# Patient Record
Sex: Female | Born: 1975 | Hispanic: No | Marital: Married | Smoking: Former smoker
Health system: Southern US, Community
[De-identification: ages and names within clinical notes are randomized; demographics above are authoritative.]

## PROBLEM LIST (undated history)

## (undated) DIAGNOSIS — O24419 Gestational diabetes mellitus in pregnancy, unspecified control: Secondary | ICD-10-CM

## (undated) DIAGNOSIS — O1490 Unspecified pre-eclampsia, unspecified trimester: Secondary | ICD-10-CM

## (undated) DIAGNOSIS — R569 Unspecified convulsions: Secondary | ICD-10-CM

## (undated) HISTORY — DX: Unspecified convulsions: R56.9

## (undated) HISTORY — DX: Unspecified pre-eclampsia, unspecified trimester: O14.90

## (undated) HISTORY — DX: Gestational diabetes mellitus in pregnancy, unspecified control: O24.419

---
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
## 2001-01-20 ENCOUNTER — Other Ambulatory Visit: Admission: RE | Admit: 2001-01-20 | Discharge: 2001-01-20 | Payer: Self-pay | Admitting: Family Medicine

## 2001-02-28 ENCOUNTER — Other Ambulatory Visit: Admission: RE | Admit: 2001-02-28 | Discharge: 2001-02-28 | Payer: Self-pay | Admitting: Obstetrics and Gynecology

## 2002-05-22 ENCOUNTER — Other Ambulatory Visit: Admission: RE | Admit: 2002-05-22 | Discharge: 2002-05-22 | Payer: Self-pay | Admitting: Gynecology

## 2002-11-02 DIAGNOSIS — R569 Unspecified convulsions: Secondary | ICD-10-CM

## 2002-11-02 HISTORY — DX: Unspecified convulsions: R56.9

## 2003-09-19 ENCOUNTER — Other Ambulatory Visit: Admission: RE | Admit: 2003-09-19 | Discharge: 2003-09-19 | Payer: Self-pay | Admitting: Obstetrics and Gynecology

## 2004-05-05 ENCOUNTER — Encounter: Payer: Self-pay | Admitting: Family Medicine

## 2004-11-01 DIAGNOSIS — O1490 Unspecified pre-eclampsia, unspecified trimester: Secondary | ICD-10-CM

## 2004-11-01 HISTORY — DX: Unspecified pre-eclampsia, unspecified trimester: O14.90

## 2005-04-29 ENCOUNTER — Ambulatory Visit: Payer: Self-pay | Admitting: Family Medicine

## 2005-05-25 ENCOUNTER — Ambulatory Visit: Payer: Self-pay | Admitting: Family Medicine

## 2006-05-11 DIAGNOSIS — R569 Unspecified convulsions: Secondary | ICD-10-CM | POA: Insufficient documentation

## 2006-05-11 DIAGNOSIS — G43909 Migraine, unspecified, not intractable, without status migrainosus: Secondary | ICD-10-CM | POA: Insufficient documentation

## 2006-08-05 ENCOUNTER — Ambulatory Visit: Payer: Self-pay | Admitting: Family Medicine

## 2006-08-05 ENCOUNTER — Encounter: Payer: Self-pay | Admitting: Family Medicine

## 2006-10-22 ENCOUNTER — Ambulatory Visit: Payer: Self-pay | Admitting: Family Medicine

## 2006-10-22 DIAGNOSIS — F3289 Other specified depressive episodes: Secondary | ICD-10-CM | POA: Insufficient documentation

## 2006-10-22 DIAGNOSIS — F329 Major depressive disorder, single episode, unspecified: Secondary | ICD-10-CM

## 2006-11-29 ENCOUNTER — Ambulatory Visit: Payer: Self-pay | Admitting: Family Medicine

## 2006-11-29 DIAGNOSIS — I10 Essential (primary) hypertension: Secondary | ICD-10-CM | POA: Insufficient documentation

## 2007-01-04 ENCOUNTER — Ambulatory Visit: Payer: Self-pay | Admitting: Family Medicine

## 2007-02-15 ENCOUNTER — Ambulatory Visit: Payer: Self-pay | Admitting: Family Medicine

## 2007-03-24 ENCOUNTER — Ambulatory Visit: Payer: Self-pay | Admitting: Family Medicine

## 2007-08-08 ENCOUNTER — Ambulatory Visit: Payer: Self-pay | Admitting: Family Medicine

## 2007-08-08 DIAGNOSIS — F4322 Adjustment disorder with anxiety: Secondary | ICD-10-CM | POA: Insufficient documentation

## 2007-12-06 ENCOUNTER — Ambulatory Visit: Payer: Self-pay | Admitting: Family Medicine

## 2007-12-06 DIAGNOSIS — J309 Allergic rhinitis, unspecified: Secondary | ICD-10-CM | POA: Insufficient documentation

## 2008-05-23 ENCOUNTER — Ambulatory Visit: Payer: Self-pay | Admitting: Family Medicine

## 2008-06-20 ENCOUNTER — Ambulatory Visit: Payer: Self-pay | Admitting: Family Medicine

## 2008-07-30 ENCOUNTER — Ambulatory Visit: Payer: Self-pay | Admitting: Family Medicine

## 2009-06-03 ENCOUNTER — Ambulatory Visit: Payer: Self-pay | Admitting: Family Medicine

## 2009-06-24 ENCOUNTER — Ambulatory Visit: Payer: Self-pay | Admitting: Family Medicine

## 2009-08-23 ENCOUNTER — Ambulatory Visit: Payer: Self-pay | Admitting: Family Medicine

## 2010-09-02 NOTE — Assessment & Plan Note (Signed)
Summary: 2 MONTH FU Anxiety   Vital Signs:  Patient profile:   35 year old female Height:      65 inches Weight:      212 pounds Pulse rate:   61 / minute BP sitting:   115 / 59  (left arm) Cuff size:   large  Vitals Entered By: Kathlene November (August 23, 2009 3:11 PM) CC: check up anxiety- pt states feeling some better since increasing but still with anxiety   Primary Care Provider:  Nani Gasser MD  CC:  check up anxiety- pt states feeling some better since increasing but still with anxiety.  History of Present Illness: check up anxiety- pt states feeling some better since increasing but still with anxiety.  lets intense anxiety. Not as frequent.  Has had alot of family stressor lately.  No longer scared to go out of the house.  sleeping OK.  Not interested in counseling at this time.  Gets about 8 hours at night.   Current Medications (verified): 1)  Lamictal 100 Mg Tabs (Lamotrigine) .... Take Three Tabs By Mouth Two Times A Day 2)  Hydrochlorothiazide 25 Mg Tabs (Hydrochlorothiazide) .... Take 1 Tablet By Mouth Once A Day 3)  Atenolol 25 Mg Tabs (Atenolol) .... Take 1 1/2 Tablets By Mouth Once A Day For Migraines 4)  Citalopram Hydrobromide 40 Mg Tabs (Citalopram Hydrobromide) .... Take 1 Tablet By Mouth Once A Day 5)  Sumatriptan Succinate 100 Mg Tabs (Sumatriptan Succinate) .... Take 1 Tablet By Mouth Once A Day As Needed Migraine. Can Repeat Dose in 2 Hours As Needed .  Allergies (verified): No Known Drug Allergies  Comments:  Nurse/Medical Assistant: The patient's medications and allergies were reviewed with the patient and were updated in the Medication and Allergy Lists. Kathlene November (August 23, 2009 3:12 PM)  Physical Exam  General:  Well-developed,well-nourished,in no acute distress; alert,appropriate and cooperative throughout examination Psych:  Cognition and judgment appear intact. Alert and cooperative with normal attention span and concentration. No  apparent delusions, illusions, hallucinations   Impression & Recommendations:  Problem # 1:  ADJUSTMENT DISORDER WITH ANXIETY (ICD-309.24) Discussed optins. Can increase the citalopram or can augment the medication with a mood stabilizer or can send for counseling. She feel counseling is not really a time option reight now. After further discussion she woudl like to continue her current dose.  Then fu in 2-3 months. If feels her mood is worse then can increase med to 1.5 tabs and fu sooner.   Complete Medication List: 1)  Lamictal 100 Mg Tabs (Lamotrigine) .... Take three tabs by mouth two times a day 2)  Hydrochlorothiazide 25 Mg Tabs (Hydrochlorothiazide) .... Take 1 tablet by mouth once a day 3)  Atenolol 25 Mg Tabs (Atenolol) .... Take 1 1/2 tablets by mouth once a day for migraines 4)  Citalopram Hydrobromide 40 Mg Tabs (Citalopram hydrobromide) .... Take 1 tablet by mouth once a day 5)  Sumatriptan Succinate 100 Mg Tabs (Sumatriptan succinate) .... Take 1 tablet by mouth once a day as needed migraine. can repeat dose in 2 hours as needed . Prescriptions: CITALOPRAM HYDROBROMIDE 40 MG TABS (CITALOPRAM HYDROBROMIDE) Take 1 tablet by mouth once a day  #30 x 3   Entered and Authorized by:   Nani Gasser MD   Signed by:   Nani Gasser MD on 08/23/2009   Method used:   Electronically to        [facility] [phone]* (retail)  [address]       [place], [place]  [address]       Ph: [phone]       Fax: [phone]   RxID:   [id number]

## 2010-09-19 ENCOUNTER — Ambulatory Visit: Payer: Self-pay | Admitting: Family Medicine

## 2010-10-01 ENCOUNTER — Encounter: Payer: Self-pay | Admitting: Family Medicine

## 2010-10-01 ENCOUNTER — Ambulatory Visit (INDEPENDENT_AMBULATORY_CARE_PROVIDER_SITE_OTHER): Payer: 59 | Admitting: Family Medicine

## 2010-10-01 DIAGNOSIS — F4322 Adjustment disorder with anxiety: Secondary | ICD-10-CM

## 2010-10-02 ENCOUNTER — Encounter: Payer: Self-pay | Admitting: Family Medicine

## 2010-10-09 NOTE — Assessment & Plan Note (Signed)
Summary: F/U mood   Vital Signs:  Patient profile:   35 year old female Height:      65 inches Weight:      211 pounds Pulse rate:   66 / minute BP sitting:   115 / 75  (right arm) Cuff size:   large  Vitals Entered By: Avon Gully CMA, Duncan Dull) (October 01, 2010 8:41 AM) CC: f/u meds   Primary Care Provider:  Nani Gasser MD  CC:  f/u meds.  History of Present Illness: Most of the time taking a whole tab but occ increases to 1.5 tabs when having a bad day.  Hasfelt worse hte last 2 weeks.  Sleep is OK.  Has been tearful.  Feels like she wouldlike a rescue med for the days where she is intensely overwhelmed or fearful of leaving of the house.   Current Medications (verified): 1)  Lamictal 100 Mg Tabs (Lamotrigine) .... Take Three Tabs By Mouth Two Times A Day 2)  Hydrochlorothiazide 25 Mg Tabs (Hydrochlorothiazide) .... Take 1 Tablet By Mouth Once A Day 3)  Atenolol 25 Mg Tabs (Atenolol) .... Take 1 1/2 Tablets By Mouth Once A Day For Migraines 4)  Citalopram Hydrobromide 40 Mg Tabs (Citalopram Hydrobromide) .... Take 1 Tablet By Mouth Once A Day  Allergies (verified): No Known Drug Allergies  Comments:  Nurse/Medical Assistant: The patient's medications and allergies were reviewed with the patient and were updated in the Medication and Allergy Lists. Avon Gully CMA, Duncan Dull) (October 01, 2010 8:42 AM)  Physical Exam  General:  Well-developed,well-nourished,in no acute distress; alert,appropriate and cooperative throughout examination Psych:  Cognition and judgment appear intact. Alert and cooperative with normal attention span and concentration. 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  Increase the citalopram to 60mg  and f/u in one month Also discussed counseling. I convicned her theis may be really helpful. She has done it in the  past. She is OK with the referral.  Orders: Psychiatric Referral (Psych)  Problem # 2:  HYPERTENSION, BENIGN ESSENTIAL (ICD-401.1) Looks great today.  Her updated medication list for this problem includes:    Hydrochlorothiazide 25 Mg Tabs (Hydrochlorothiazide) .Marland Kitchen... Take 1 tablet by mouth once a day    Atenolol 25 Mg Tabs (Atenolol) .Marland Kitchen... Take 1 1/2 tablets by mouth once a day for migraines  Complete Medication List: 1)  Lamictal 100 Mg Tabs (Lamotrigine) .... Take three tabs by mouth two times a day 2)  Hydrochlorothiazide 25 Mg Tabs (Hydrochlorothiazide) .... Take 1 tablet by mouth once a day 3)  Atenolol 25 Mg Tabs (Atenolol) .... Take 1 1/2 tablets by mouth once a day for migraines 4)  Citalopram Hydrobromide 40 Mg Tabs (Citalopram hydrobromide) .... Take 1.5  tablet by mouth once a day 5)  Alprazolam 0.25 Mg Tabs (Alprazolam) .... Take 1 tablet by mouth once a day as needed  Patient Instructions: 1)  Please schedule a follow-up appointment in 1 month for mood.  Prescriptions: HYDROCHLOROTHIAZIDE 25 MG TABS (HYDROCHLOROTHIAZIDE) Take 1 tablet by mouth once a day  #90 x 1   Entered and Authorized by:   Nani Gasser MD   Signed by:   Nani Gasser MD on 10/01/2010   Method used:   Electronically to        [facility] [phone]* (retail)       [address]  [place], [place]  [address]       Ph: [phone]       Fax: [phone]   RxID:   [id number] ALPRAZOLAM 0.25 MG TABS (ALPRAZOLAM) Take 1 tablet by mouth once a day as needed  #15 x 0   Entered and Authorized by:   Nani Gasser MD   Signed by:   Nani Gasser MD on 10/01/2010   Method used:   Printed then faxed to ...       [facility] [phone]* (retail)       [address] [place], [place]  [address]       Ph: [phone]       Fax: [phone]   RxID:   [id number] CITALOPRAM HYDROBROMIDE 40 MG TABS (CITALOPRAM HYDROBROMIDE) Take 1.5  tablet by mouth once a day   #45 x 2   Entered and Authorized by:   Nani Gasser MD   Signed by:   Nani Gasser MD on 10/01/2010   Method used:   Electronically to        [facility] [phone]* (retail)       [address]       [place], [place]  [address]       Ph: [phone]       Fax: [phone]   RxID:   [id number]    Orders Added: 1)  Psychiatric Referral [Psych] 2)  Est. Patient Level III [09[address]]

## 2010-10-21 NOTE — Letter (Signed)
Summary: Generalized Anxiety Disorder Scale  Generalized Anxiety Disorder Scale   Imported By: Maryln Gottron 10/14/2010 12:47:15  _____________________________________________________________________  External Attachment:    Type:   Image     Comment:   External Document

## 2010-10-30 ENCOUNTER — Ambulatory Visit: Payer: 59 | Admitting: Family Medicine

## 2010-11-12 ENCOUNTER — Ambulatory Visit: Payer: 59 | Admitting: Family Medicine

## 2010-11-13 ENCOUNTER — Encounter: Payer: Self-pay | Admitting: Family Medicine

## 2010-11-23 ENCOUNTER — Encounter: Payer: Self-pay | Admitting: Family Medicine

## 2010-11-24 ENCOUNTER — Encounter: Payer: Self-pay | Admitting: Family Medicine

## 2010-11-24 ENCOUNTER — Ambulatory Visit (INDEPENDENT_AMBULATORY_CARE_PROVIDER_SITE_OTHER): Payer: 59 | Admitting: Family Medicine

## 2010-11-24 DIAGNOSIS — N920 Excessive and frequent menstruation with regular cycle: Secondary | ICD-10-CM | POA: Insufficient documentation

## 2010-11-24 DIAGNOSIS — F4322 Adjustment disorder with anxiety: Secondary | ICD-10-CM

## 2010-11-24 DIAGNOSIS — F329 Major depressive disorder, single episode, unspecified: Secondary | ICD-10-CM

## 2010-11-24 DIAGNOSIS — F3289 Other specified depressive episodes: Secondary | ICD-10-CM

## 2010-11-24 LAB — CBC
Hemoglobin: 12.3 g/dL (ref 12.0–15.0)
MCH: 28.1 pg (ref 26.0–34.0)
MCV: 87.7 fL (ref 78.0–100.0)
Platelets: 287 10*3/uL (ref 150–400)
RBC: 4.38 MIL/uL (ref 3.87–5.11)
WBC: 6.5 10*3/uL (ref 4.0–10.5)

## 2010-11-24 MED ORDER — FLUOXETINE HCL 20 MG PO TABS: ORAL_TABLET | ORAL | Status: DC

## 2010-11-24 NOTE — Assessment & Plan Note (Signed)
GAD - 7 score if 10 today,. She is still struggling with anxiety. Her depression is much better controlled. I'm also worried about QT prolongation with higher doses of citalopram. Based on this I offered her 3 options. One we could add a mood stabilizer to her her citalopram and try to drop back down to 40 mg. We could consider changing her to a different SSRI to see if we could get better control of her symptoms. We'll reconsider counseling. Patient opted to try maybe a different SSRI. We will change her to fluoxetine and see her back in 3 weeks. Reviewed once again the potential side effects. However I wrote down a taper for her.

## 2010-11-24 NOTE — Progress Notes (Signed)
  Subjective:    Patient ID: Julie Carroll, female    DOB: 10-16-1975, 35 y.o.   MRN: [id number]  Anxiety Presents for follow-up visit. Patient reports no chest pain or insomnia. Primary symptoms comment: moved part of citalopram to bedtime becasue too sedating.  Symptoms occur constantly. The severity of symptoms is moderate. The patient sleeps 8 hours per night. The quality of sleep is good.   Compliance with medications is 76-100%. Treatment side effects: sedation.  Using her xanax 3 times since was last here.  Usually uses half a tab. She is on 60mg  of citalopram.    She also has heavy periods and her GYN has asked that we check her Thryoid level.    Review of Systems  Cardiovascular: Negative for chest pain.  Psychiatric/Behavioral: The patient does not have insomnia.        Objective:   Physical Exam  Constitutional: She appears well-developed and well-nourished.  HENT:  Head: Normocephalic and atraumatic.  Neck: No thyromegaly present.  Cardiovascular: Normal rate, regular rhythm and normal heart sounds.   Pulmonary/Chest: Effort normal and breath sounds normal.  Lymphadenopathy:    She has no cervical adenopathy.          Assessment & Plan:

## 2010-11-24 NOTE — Assessment & Plan Note (Signed)
Will taper the citalopram and change to fluoxetine.  Then f/u first week of June.

## 2010-11-24 NOTE — Patient Instructions (Signed)
Cut the citalopram down to 40mg  daily ( 1 tab for 5 days), then 1/2 tab for 5 days, then 1/2 tab every other day for 10 days.  When get to the every other day then go ahead and start the fluoxetine. 10mg  daily (1/2 a tab) for one week, then increase to 20mg  daily.  We will call you with your lab results.

## 2010-11-25 NOTE — Progress Notes (Signed)
  Subjective:    Patient ID: Julie Carroll, female    DOB: May 11, 1976, 35 y.o.   MRN: [id number]  HPI    Review of Systems     Objective:   Physical Exam        Assessment & Plan:  Thyroid and blood count look great!

## 2010-11-26 ENCOUNTER — Ambulatory Visit (HOSPITAL_COMMUNITY): Payer: 59 | Admitting: Psychiatry

## 2010-11-28 ENCOUNTER — Telehealth: Payer: Self-pay | Admitting: Family Medicine

## 2010-11-28 NOTE — Telephone Encounter (Signed)
She can use the xanax through the weaning process, Does she need for citalopram for the wean?  If so can send over a refill.

## 2010-11-28 NOTE — Telephone Encounter (Signed)
Pt notified that she can continue using Xanex for weaning process, and pt stated she does have enough Citalopram for the weaning process. Jarvis Newcomer, LPN [facility]

## 2010-11-28 NOTE — Telephone Encounter (Signed)
Pt called in for triage this morning and said she was recently seen for weaning of Citalopram 60 mg.  She is down to her third day of 40mg  with two more days of this dose then further weaning as ordered.  She has used some of her Xanex  (1/2) 0.25 mg she had previously due to increase of anxeity, low level with chest hurting, an trouble concentrating.  Wants to know if Xanex through this process if okay until she can start her new med of Fluoxetine or would you advise of a different plan.  Please advise. PLAN:  Note sent to Dr. Linford Arnold and pending recommendations. Triage/SHT

## 2011-01-05 ENCOUNTER — Ambulatory Visit (HOSPITAL_COMMUNITY): Payer: 59 | Admitting: Psychiatry

## 2011-01-12 ENCOUNTER — Encounter: Payer: Self-pay | Admitting: Family Medicine

## 2011-01-12 ENCOUNTER — Ambulatory Visit (INDEPENDENT_AMBULATORY_CARE_PROVIDER_SITE_OTHER): Payer: PRIVATE HEALTH INSURANCE | Admitting: Family Medicine

## 2011-01-12 ENCOUNTER — Ambulatory Visit (INDEPENDENT_AMBULATORY_CARE_PROVIDER_SITE_OTHER): Payer: PRIVATE HEALTH INSURANCE | Admitting: Psychology

## 2011-01-12 DIAGNOSIS — F411 Generalized anxiety disorder: Secondary | ICD-10-CM

## 2011-01-12 DIAGNOSIS — F4322 Adjustment disorder with anxiety: Secondary | ICD-10-CM

## 2011-01-12 MED ORDER — ESCITALOPRAM OXALATE 20 MG PO TABS: 20.0000 mg | ORAL_TABLET | Freq: Every day | ORAL | Status: DC

## 2011-01-12 NOTE — Assessment & Plan Note (Addendum)
GAD-7 score of 5 today.  She still doesn't feel therapeutic.  Will change to lexapro 20mg  and see if gets a slightly better response. F/U in 3 weeks.

## 2011-01-12 NOTE — Progress Notes (Signed)
  Subjective:    Patient ID: Julie Carroll, female    DOB: 13-Mar-1976, 35 y.o.   MRN: [id number]  HPI Started the fluoxetine and felt nauseated and more irritable. Then stopped it.  Then restarted half the celexa.  Then increased to 40mg .  WAs having some breakthrough anxiety on the 60mg . She started her counseling today.  Now feeling better. Nausea has abated.    Review of Systems     Objective:   Physical Exam  Constitutional: She is oriented to person, place, and time.  Neurological: She is alert and oriented to person, place, and time.  Psychiatric: She has a normal mood and affect. Her behavior is normal.          Assessment & Plan:

## 2011-01-26 ENCOUNTER — Encounter (INDEPENDENT_AMBULATORY_CARE_PROVIDER_SITE_OTHER): Payer: PRIVATE HEALTH INSURANCE | Admitting: Psychology

## 2011-01-26 DIAGNOSIS — F411 Generalized anxiety disorder: Secondary | ICD-10-CM

## 2011-01-28 ENCOUNTER — Ambulatory Visit (HOSPITAL_COMMUNITY): Payer: 59 | Admitting: Psychiatry

## 2011-02-05 ENCOUNTER — Ambulatory Visit (INDEPENDENT_AMBULATORY_CARE_PROVIDER_SITE_OTHER): Payer: PRIVATE HEALTH INSURANCE | Admitting: Family Medicine

## 2011-02-05 ENCOUNTER — Encounter: Payer: Self-pay | Admitting: Family Medicine

## 2011-02-05 DIAGNOSIS — F4322 Adjustment disorder with anxiety: Secondary | ICD-10-CM

## 2011-02-05 NOTE — Progress Notes (Signed)
  Subjective:    Patient ID: Julie Carroll, female    DOB: 1976-05-16, 35 y.o.   MRN: [id number]  HPI  Doing well on the Lexapro. Feels more therapeutic.  Hasn't had to use her xanax. Sleeping well. No SE of the medications.  Moved ti to bedtime. This is workin better than the citalopram. She is on the generic.  Has had some very stressful things happen this week and says she really handled it well overall.   Review of Systems     Objective:   Physical Exam  Constitutional: She is oriented to person, place, and time. She appears well-developed and well-nourished.  Cardiovascular: Normal rate, regular rhythm and normal heart sounds.   Pulmonary/Chest: Effort normal and breath sounds normal.  Neurological: She is alert and oriented to person, place, and time.  Skin: Skin is warm and dry.  Psychiatric: She has a normal mood and affect. Her behavior is normal.          Assessment & Plan:

## 2011-02-05 NOTE — Assessment & Plan Note (Signed)
Improved and she feels more therapeutic on the lexapro. GAD-7 score of 7 today. F/U in 3 months.

## 2011-02-10 ENCOUNTER — Encounter (HOSPITAL_COMMUNITY): Payer: PRIVATE HEALTH INSURANCE | Admitting: Psychology

## 2011-02-18 ENCOUNTER — Telehealth: Payer: Self-pay | Admitting: Family Medicine

## 2011-02-18 NOTE — Telephone Encounter (Signed)
Pt called and having severe leg pain X 1 1/2 weeks. Plan:m LMOM for the pt told her to call office and sched an appt to be seen. Jarvis Newcomer, LPN [facility]

## 2011-02-19 ENCOUNTER — Encounter (HOSPITAL_COMMUNITY): Payer: PRIVATE HEALTH INSURANCE | Admitting: Psychology

## 2011-03-05 ENCOUNTER — Encounter (HOSPITAL_COMMUNITY): Payer: PRIVATE HEALTH INSURANCE | Admitting: Psychology

## 2011-03-11 ENCOUNTER — Encounter (INDEPENDENT_AMBULATORY_CARE_PROVIDER_SITE_OTHER): Payer: PRIVATE HEALTH INSURANCE | Admitting: Psychology

## 2011-03-11 DIAGNOSIS — F411 Generalized anxiety disorder: Secondary | ICD-10-CM

## 2011-03-12 ENCOUNTER — Other Ambulatory Visit: Payer: Self-pay | Admitting: *Deleted

## 2011-03-12 MED ORDER — ALPRAZOLAM 0.25 MG PO TABS: 0.2500 mg | ORAL_TABLET | Freq: Every day | ORAL | Status: DC | PRN

## 2011-03-13 ENCOUNTER — Other Ambulatory Visit: Payer: Self-pay | Admitting: Family Medicine

## 2011-03-24 ENCOUNTER — Telehealth: Payer: Self-pay | Admitting: Family Medicine

## 2011-03-24 NOTE — Telephone Encounter (Signed)
Patient called advised that she received her prescription at pharmacy from [facility] but there was a note attached that states she needs to make an appointment and she has appointment scheduled for Oct. Pt left vm to see what the note attached to med was in reference too

## 2011-04-01 ENCOUNTER — Encounter (HOSPITAL_COMMUNITY): Payer: PRIVATE HEALTH INSURANCE | Admitting: Psychology

## 2011-04-09 ENCOUNTER — Encounter (INDEPENDENT_AMBULATORY_CARE_PROVIDER_SITE_OTHER): Payer: PRIVATE HEALTH INSURANCE | Admitting: Psychology

## 2011-04-09 DIAGNOSIS — F411 Generalized anxiety disorder: Secondary | ICD-10-CM

## 2011-04-21 ENCOUNTER — Telehealth: Payer: Self-pay | Admitting: Family Medicine

## 2011-04-21 MED ORDER — HYDROCHLOROTHIAZIDE 25 MG PO TABS: 25.0000 mg | ORAL_TABLET | Freq: Every day | ORAL | Status: DC

## 2011-04-22 NOTE — Telephone Encounter (Signed)
Closed

## 2011-04-30 ENCOUNTER — Encounter (INDEPENDENT_AMBULATORY_CARE_PROVIDER_SITE_OTHER): Payer: PRIVATE HEALTH INSURANCE | Admitting: Psychology

## 2011-04-30 DIAGNOSIS — F411 Generalized anxiety disorder: Secondary | ICD-10-CM

## 2011-05-08 ENCOUNTER — Telehealth: Payer: Self-pay | Admitting: *Deleted

## 2011-05-08 MED ORDER — CYCLOBENZAPRINE HCL 10 MG PO TABS: 10.0000 mg | ORAL_TABLET | Freq: Every evening | ORAL | Status: AC | PRN

## 2011-05-08 MED ORDER — TRAMADOL HCL 50 MG PO TABS: 50.0000 mg | ORAL_TABLET | Freq: Three times a day (TID) | ORAL | Status: DC | PRN

## 2011-05-08 NOTE — Telephone Encounter (Signed)
Pt notified of MD instructions. Meds entered and sent to [facility] in [place]. KJ LPN

## 2011-05-08 NOTE — Telephone Encounter (Signed)
OK for flexeril 10mg  at bedtime and tramadol 50mg  up to tid. Also ice packs to the area. Can schedule for Monday if not getting better over the weekend.

## 2011-05-08 NOTE — Telephone Encounter (Signed)
Pt calls and states was sitting in a chair yesterday and it broke and is now having pain in shoulder blades down to waist. No radicular pain. Afraid to bend over in fear won't get back up. Feels tight and sore to touch. No appts today. Is there anything you can give her

## 2011-05-12 ENCOUNTER — Ambulatory Visit (INDEPENDENT_AMBULATORY_CARE_PROVIDER_SITE_OTHER): Payer: PRIVATE HEALTH INSURANCE | Admitting: Family Medicine

## 2011-05-12 ENCOUNTER — Encounter: Payer: Self-pay | Admitting: Family Medicine

## 2011-05-12 VITALS — BP 112/62 | HR 76 | Wt 232.0 lb

## 2011-05-12 DIAGNOSIS — M79606 Pain in leg, unspecified: Secondary | ICD-10-CM

## 2011-05-12 DIAGNOSIS — M949 Disorder of cartilage, unspecified: Secondary | ICD-10-CM

## 2011-05-12 DIAGNOSIS — F419 Anxiety disorder, unspecified: Secondary | ICD-10-CM

## 2011-05-12 DIAGNOSIS — M898X9 Other specified disorders of bone, unspecified site: Secondary | ICD-10-CM

## 2011-05-12 DIAGNOSIS — M79609 Pain in unspecified limb: Secondary | ICD-10-CM

## 2011-05-12 DIAGNOSIS — F411 Generalized anxiety disorder: Secondary | ICD-10-CM

## 2011-05-12 DIAGNOSIS — Z23 Encounter for immunization: Secondary | ICD-10-CM

## 2011-05-12 DIAGNOSIS — M899 Disorder of bone, unspecified: Secondary | ICD-10-CM

## 2011-05-12 DIAGNOSIS — M549 Dorsalgia, unspecified: Secondary | ICD-10-CM

## 2011-05-12 MED ORDER — HYDROCODONE-ACETAMINOPHEN 5-325 MG PO TABS: 2.0000 | ORAL_TABLET | Freq: Four times a day (QID) | ORAL | Status: AC | PRN

## 2011-05-12 NOTE — Patient Instructions (Addendum)
Back Pain Back pain is common. It can be the result of a muscle pull or strain. It can also come from changes in the bones and nerves in the spine. These changes can come from an injury or from calcium loss in your bones (osteoporosis).   HOME CARE  Listen to your doctor about how active you should be. Back pain can get worse with activity that is not approved by your doctor.   Avoid sports with sudden movements.   Ask your doctor for a list of exercises to make your back muscles stronger.   Learn how to lift or move things safely to protect your back.   Sit up straight.   Sometimes, using ice for 10 minutes, 2 times per day can help the pain.   Put ice in a plastic bag.   Place a towel between the skin and the bag.   Sometimes heat or a hot shower will help the pain, especially if it is from a muscle cramp.  GET HELP RIGHT AWAY IF:  You have numbness, tingling, weakness, or problems with the use of your arms or legs.   You get very bad back pain that does not go away with medicine.   You cannot control when you poop (bowel movement) or pee (urinate).   You have increasing pain in any area of the body. This includes your belly (abdomen).   You are short of breath, dizzy, or feel faint.   You feel sick to your stomach (nauseous), throw up (vomit), or get sweaty.   You notice your toes, legs, or feet change color or get cold.   You have been injured and your back hurts.   Your back pain gets worse.   You have a temperature by mouth above 101, not controlled by medicine.  MAKE SURE YOU:    Understand these instructions.   Will watch your condition.   Will get help right away if you are not doing well or get worse.  Document Released: 01/06/2008 Document Re-Released: 10/14/2009 [facility] Patient Information 2011 [place], [place].

## 2011-05-12 NOTE — Progress Notes (Signed)
Subjective:    Patient ID: Julie Carroll, female    DOB: [birth year], 35 y.o.   MRN: [id number]  HPI Anxiety  -doing well.  Has been seeing Julie Carroll.  This has been really helpful. She feels she would like to continue the medication for now. She and We have decided to wean back her visits.  Hurt Back on Thursday (about 6 days) pain initlaly over the thoracid spine, now more on the left. Using flexeril at bedtime and this time.  Was also given tramadol but caused some HA.  Also using motrin 600mg  nad not really helping.  Using ice. No stretches. No radiation of pain tino her legs. No numbness or weakness. No really getting symptoms. She says it was worse last night she tried to pick up the ceramic bowl from her crockpot. She has sudden sharp pain at that point.  Chronci bilat leg pain since a child. In upper and lower legs .Describes as an ache. No cramping or charlie horses. Tried eating bananas but no relief. She will usually tries up a hot tub massage her muscles and this does seem to help some. She's not sure why she gets this. She said when she was a child she was told was going pains and that she would outgrow it but she never has. She feels is actually been getting worse lately. No worsening symptoms. She has tried some stretches but this does not seem to help.  Review of Systems BP 112/62  Pulse 76  Wt 232 lb (105.235 kg)    Allergies  Allergen Reactions  . Fluoxetine     Nausea and inc irritability    Past Medical History  Diagnosis Date  . Gestational diabetes   . Preeclampsia 4/06  . Seizures 4/04    Past Surgical History  Procedure Date  . Cesarean section 4/06  . Fatty tumor removed 1996    History   Social History  . Marital Status: Married    Spouse Name: N/A    Number of Children: N/A  . Years of Education: N/A   Occupational History  . Not on file.   Social History Main Topics  . Smoking status: Never Smoker   . Smokeless tobacco: Not on file  . Alcohol  Use: No  . Drug Use: No  . Sexually Active: Yes    Birth Control/ Protection: Condom   Other Topics Concern  . Not on file   Social History Narrative  . No narrative on file    Family History  Problem Relation Age of Onset  . Diabetes Father   . Cancer Maternal Grandmother     skin    Julie Carroll had no medications administered during this visit.     Objective:   Physical Exam  Constitutional: She is oriented to person, place, and time. She appears well-developed and well-nourished.  Cardiovascular: Normal rate, regular rhythm and normal heart sounds.   Pulmonary/Chest: Effort normal and breath sounds normal.  Musculoskeletal:       Normal flexion and extension Pain with extension. Normal rotation right and left and side bending. Neg straight leg raise. Hip, knee and ankle strength 5/5 bilat patellar and bicep reflexes 1+ bilat.   Neurological: She is alert and oriented to person, place, and time.  Skin: Skin is warm and dry.  Psychiatric: She has a normal mood and affect. Her behavior is normal.          Assessment & Plan:  Anxiety - doing well on  her Lexapro. GAD- 7 score of 5. F/U in 4 months. She is doing really well. She would like to continue her current regimen. When I see her back in 4 months we can discuss potentially weaning her off if she is doing really well.  Acute Left low back pain- Given stretches. Use hydrocodone at bedtime. She doesn't need refill on her muscle relaxer. If she is not improving in the next couple weeks and consider formal physical therapy. He also consider x-rays at that time. For her to continue with her anti-inflammatory of Motrin 600 mg 3 times a day with food and water.  Chroinc leg pain-unclear etiology at this point. I doubt this is significant musculoskeletal disorder. I will check a CK, and a sedimentation rate. Also consider that this could be more bone type pain. She says her mom has a history of low vitamin D. I did recommend  that we check her level as well. She says she does try to do stretches and exercises to help it but it doesn't really seem to make a difference. She says sometimes it's so painful she is almost in tears. This is definitely not medication related as it started in her childhood.

## 2011-05-28 ENCOUNTER — Encounter (HOSPITAL_COMMUNITY): Payer: PRIVATE HEALTH INSURANCE | Admitting: Psychology

## 2011-06-11 ENCOUNTER — Encounter (HOSPITAL_COMMUNITY): Payer: Self-pay | Admitting: Psychology

## 2011-06-11 ENCOUNTER — Ambulatory Visit (INDEPENDENT_AMBULATORY_CARE_PROVIDER_SITE_OTHER): Payer: PRIVATE HEALTH INSURANCE | Admitting: Psychology

## 2011-06-11 DIAGNOSIS — F411 Generalized anxiety disorder: Secondary | ICD-10-CM

## 2011-06-11 DIAGNOSIS — F419 Anxiety disorder, unspecified: Secondary | ICD-10-CM

## 2011-06-11 NOTE — Progress Notes (Signed)
   THERAPIST PROGRESS NOTE  Session Time: 805- 828 am  Participation Level: Active  Behavioral Response: Well GroomedAlertEuthymic  Type of Therapy: Individual Therapy  Treatment Goals addressed: Diagnosis: generalized anxiety  Interventions: CBT, Solution Focused and Strength-based  Summary: Julie Carroll is a 35 y.o. female who presents with anxiety issues.  She has been doing well for the past several months and has been managing her anxiety effectively.  She reports when she notices the anxiety she begins dealing with it and will not ignore.  She believes that she is ready to take a break from therapy with plans to come back after the holidays.  We talked about her plans for the holidays and how she manages.  She admits that it is stress-provoking and we discussed what was in her control and what she could do when feeling overwhelmed.  She talked about not being able to join her family on the holidays due to her father's refusal to permit her husband Jerod in the home.  Her mother talks about this and the client occasionally notices she allows this to influence her mood.   Suicidal/Homicidal: No  Plan: Return again in January 2012 for check-in visit.  Diagnosis: Axis I: Generalized Anxiety Disorder    Axis II: No diagnosis    Salley Scarlet, [facility] 06/11/2011

## 2011-07-15 ENCOUNTER — Other Ambulatory Visit: Payer: Self-pay | Admitting: *Deleted

## 2011-07-15 MED ORDER — ESCITALOPRAM OXALATE 20 MG PO TABS: 20.0000 mg | ORAL_TABLET | Freq: Every day | ORAL | Status: DC

## 2011-07-16 ENCOUNTER — Other Ambulatory Visit: Payer: Self-pay | Admitting: *Deleted

## 2011-07-16 MED ORDER — HYDROCHLOROTHIAZIDE 25 MG PO TABS: 25.0000 mg | ORAL_TABLET | Freq: Every day | ORAL | Status: DC

## 2011-10-19 ENCOUNTER — Encounter: Payer: Self-pay | Admitting: Family Medicine

## 2011-10-19 ENCOUNTER — Ambulatory Visit (INDEPENDENT_AMBULATORY_CARE_PROVIDER_SITE_OTHER): Payer: PRIVATE HEALTH INSURANCE | Admitting: Family Medicine

## 2011-10-19 VITALS — BP 119/64 | HR 70 | Wt 241.0 lb

## 2011-10-19 DIAGNOSIS — E669 Obesity, unspecified: Secondary | ICD-10-CM

## 2011-10-19 DIAGNOSIS — J309 Allergic rhinitis, unspecified: Secondary | ICD-10-CM

## 2011-10-19 DIAGNOSIS — G43909 Migraine, unspecified, not intractable, without status migrainosus: Secondary | ICD-10-CM

## 2011-10-19 MED ORDER — FLUTICASONE PROPIONATE 50 MCG/ACT NA SUSP: 2.0000 | Freq: Every day | NASAL | Status: DC

## 2011-10-19 MED ORDER — ATENOLOL 25 MG PO TABS: 25.0000 mg | ORAL_TABLET | Freq: Two times a day (BID) | ORAL | Status: DC

## 2011-10-19 MED ORDER — ALPRAZOLAM 0.25 MG PO TABS: 0.2500 mg | ORAL_TABLET | Freq: Every day | ORAL | Status: DC | PRN

## 2011-10-19 NOTE — Patient Instructions (Signed)

## 2011-10-19 NOTE — Progress Notes (Signed)
  Subjective:    Patient ID: Julie Carroll, female    DOB: Mar 15, 1976, 36 y.o.   MRN: [id number]  HPI Headaches - had 4 full migrains last week and one this week.  Given samples of relpax by her neurologist,  but made her limbs felt really heavy. . Didn't like get chest pressure on imitrex.  Took maxalt and worked well, but has been years.  She definitely has  Hormonal HA. No nasal congestion, runny nose, sneezing, itching or postnasal drip..  She does have severe nasal allergies in the spring. .  She has been taking zyrtec for the past couple of weeks.  She says she is just frustrated and really wants to get her headaches under control. She is starting on Lamictal for other reasons and she takes atenolol 25 mg once a day.  She is also very concerned about her weight.  She woulod like to discuss options to help her lose weight. She is exercising some but not regularly. Says not really calorie counting.   Review of Systems     Objective:   Physical Exam  Constitutional: She is oriented to person, place, and time. She appears well-developed and well-nourished.  HENT:  Head: Normocephalic and atraumatic.  Cardiovascular: Normal rate, regular rhythm and normal heart sounds.   Pulmonary/Chest: Effort normal and breath sounds normal.  Neurological: She is alert and oriented to person, place, and time.  Skin: Skin is warm and dry.  Psychiatric: She has a normal mood and affect. Her behavior is normal.          Assessment & Plan:  Migraines - Given samples of maxalt mxt 10. She can retry this since it did work well for her years ago. If not then we can consider something else such as Zomig. Also recommend increasing her atenolol to twice a day and see if this helps well. She is also on Lamictal which should be helping her headaches.  AR - Continue oral antihistamine.  Will add nasal steroid spray.  Spring tends to be a bad time of year for her so I think we'll try him to fall. I agree with her  that sometimes it's complicated to figure out which one it is and nasal symptoms versus migraines. No explained to her that typically she will have nasal congestion, runny nose, sneezing, itching or postnasal drip but will let her know that this is more related to her sinus cavities versus just pressure which can sometimes be her migraines.  Obesity - Discusse working with a nutritionist and working on her exercise. If still sruggling then follow up and can consider a weight loss medication but in conjunction with dietary changes and regular exercise.

## 2011-10-28 ENCOUNTER — Other Ambulatory Visit: Payer: Self-pay | Admitting: *Deleted

## 2011-10-28 NOTE — Telephone Encounter (Signed)
I am not sure. She really needs to be on atenolol 25 mg bid.  Not 200. Have her verify dose on her bottle.

## 2011-10-28 NOTE — Telephone Encounter (Signed)
Pt states bottle says 100mg . Pt informed not to take no more than 100mg  daily of the Atenolol.

## 2011-10-28 NOTE — Telephone Encounter (Signed)
Pt states that she is taking Atenolol 200mg  a day now that it has been increased. She is needing a refill but our notes and system show 25mg  with her taking BID. Please advise.

## 2011-11-08 ENCOUNTER — Other Ambulatory Visit: Payer: Self-pay | Admitting: Family Medicine

## 2011-12-02 ENCOUNTER — Other Ambulatory Visit: Payer: Self-pay | Admitting: Family Medicine

## 2011-12-03 ENCOUNTER — Other Ambulatory Visit: Payer: Self-pay | Admitting: *Deleted

## 2011-12-03 MED ORDER — RIZATRIPTAN BENZOATE 10 MG PO TBDP: 10.0000 mg | ORAL_TABLET | ORAL | Status: DC | PRN

## 2012-02-07 ENCOUNTER — Other Ambulatory Visit: Payer: Self-pay | Admitting: Family Medicine

## 2012-03-01 ENCOUNTER — Other Ambulatory Visit: Payer: Self-pay | Admitting: Family Medicine

## 2012-04-07 ENCOUNTER — Other Ambulatory Visit: Payer: Self-pay | Admitting: Family Medicine

## 2012-04-29 ENCOUNTER — Other Ambulatory Visit: Payer: Self-pay | Admitting: Family Medicine

## 2012-07-08 ENCOUNTER — Encounter (HOSPITAL_COMMUNITY): Payer: Self-pay | Admitting: Psychology

## 2012-07-08 NOTE — Progress Notes (Signed)
Outpatient Therapist Discharge Summary  Julie Carroll    02/13/1976   Admission Date: prior to EMR   Discharge Date:  07/08/2012 Reason for Discharge:  Administrative closure.  No contact with patient in one year. Diagnosis:  Axis I:  GAD  Comments:  Patient eligible to return as desired.  Salley Scarlet

## 2012-08-10 ENCOUNTER — Ambulatory Visit (INDEPENDENT_AMBULATORY_CARE_PROVIDER_SITE_OTHER): Payer: PRIVATE HEALTH INSURANCE

## 2012-08-10 ENCOUNTER — Ambulatory Visit (INDEPENDENT_AMBULATORY_CARE_PROVIDER_SITE_OTHER): Payer: PRIVATE HEALTH INSURANCE | Admitting: Family Medicine

## 2012-08-10 ENCOUNTER — Encounter: Payer: Self-pay | Admitting: Family Medicine

## 2012-08-10 VITALS — BP 114/70 | HR 71 | Temp 97.8°F | Resp 18 | Wt 245.0 lb

## 2012-08-10 DIAGNOSIS — M79609 Pain in unspecified limb: Secondary | ICD-10-CM

## 2012-08-10 DIAGNOSIS — M79672 Pain in left foot: Secondary | ICD-10-CM

## 2012-08-10 DIAGNOSIS — M25579 Pain in unspecified ankle and joints of unspecified foot: Secondary | ICD-10-CM

## 2012-08-10 DIAGNOSIS — Z23 Encounter for immunization: Secondary | ICD-10-CM

## 2012-08-10 DIAGNOSIS — M79673 Pain in unspecified foot: Secondary | ICD-10-CM

## 2012-08-10 NOTE — Progress Notes (Signed)
  Subjective:    Patient ID: Julie Carroll, female    DOB: [birth year], 37 y.o.   MRN: [id number]  HPI Was getting dressed and lost her balance and her ankle popped on 07/07/12.  She then went to [employer] [facility] same day.  Told not fracture but had fluid.  She was put in ankle brace and crutches for a week.  Stil hurts going up staris. Swell and hurt is has been up on it for 2 hours. All shoes hurt.  Using IBU when pain is bad.  Has used heat and ice.  Feels hot to touch when swells.  Has sprained her ankles multiple times.    She also complains of some left ear pain it's been going on for last couple months. She says it doesn't seem to bother her when she first gets out of bed but seems to get worse if she's up on her heel longer. She does go barefoot most days. She says she's tried multiple different types of shoe wear. She has noticed that her arches are not as high as they used to be. The pain is primarily in the heel and does not radiate. She usually does not take medication for this. She usually just has to rest her feet when it bothers her.  Review of Systems     Objective:   Physical Exam  Constitutional: She appears well-developed and well-nourished.  Musculoskeletal:       Left ankle with swelling over the lateral malleolus.  NROM. No inc laxity but pain with anterior drawer.  Strength is 5/5 in all directions. Non tender over distal foot.  Mild collapse of arches with some toe splaying.           Assessment & Plan:  Ankle sprain - will get xray to eval further.  I suspect it will likely just be a severe strain. Her range of motion and her strength is completely intact I do think that this is something that will heal but it may take more time. We also discussed rehabilitation exercises by doing ABCDs with her ankle. She does have a sleeve that she can wear for support. I think this would be perfect since the actual ankle brace was too sedated and was causing swelling of her toes. I will  cause his results are back. Continue anti-inflammatory as needed. Elevate when she is able to.  Heel pain-prescription is not quite consistent with plantar fasciitis. She may also just have some inflammation of the fat pad on her heel. She mostly walks around barefoot during the day. And when she does wear shoes she says she doesn't like wearing things like sneakers but she has tried multiple different kinds of shoes without any significant relief. I will look for heel spur on x-ray. We also discussed a custom orthotics could be very helpful in treating her pain. Certainly encourage her to think about it. If she's interested I recommended she see my partner Dr. Rodney Langton for custom orthotics.

## 2012-08-10 NOTE — Patient Instructions (Addendum)
We will call you with the results 

## 2012-08-12 ENCOUNTER — Other Ambulatory Visit: Payer: Self-pay | Admitting: Family Medicine

## 2012-08-26 ENCOUNTER — Ambulatory Visit: Payer: PRIVATE HEALTH INSURANCE | Admitting: Family Medicine

## 2012-08-29 ENCOUNTER — Encounter: Payer: Self-pay | Admitting: Family Medicine

## 2012-08-29 ENCOUNTER — Ambulatory Visit (INDEPENDENT_AMBULATORY_CARE_PROVIDER_SITE_OTHER): Payer: PRIVATE HEALTH INSURANCE | Admitting: Family Medicine

## 2012-08-29 VITALS — BP 108/69 | HR 65 | Wt 248.0 lb

## 2012-08-29 DIAGNOSIS — M79609 Pain in unspecified limb: Secondary | ICD-10-CM

## 2012-08-29 DIAGNOSIS — M79672 Pain in left foot: Secondary | ICD-10-CM

## 2012-08-29 DIAGNOSIS — S93409A Sprain of unspecified ligament of unspecified ankle, initial encounter: Secondary | ICD-10-CM

## 2012-08-29 DIAGNOSIS — S93402A Sprain of unspecified ligament of left ankle, initial encounter: Secondary | ICD-10-CM

## 2012-08-29 DIAGNOSIS — M8430XA Stress fracture, unspecified site, initial encounter for fracture: Secondary | ICD-10-CM

## 2012-08-29 DIAGNOSIS — M79673 Pain in unspecified foot: Secondary | ICD-10-CM

## 2012-08-29 MED ORDER — ESCITALOPRAM OXALATE 20 MG PO TABS: 20.0000 mg | ORAL_TABLET | Freq: Every day | ORAL | Status: DC

## 2012-08-29 NOTE — Progress Notes (Signed)
  Subjective:    Patient ID: Julie Carroll, female    DOB: 1975-10-16, 37 y.o.   MRN: [id number]  HPI Left ankle sprain-she says she's about 80% better. Doing well overall. She cannot go up and down stairs without significant pain. She's no longer taking anti-inflammatories and no longer needing to wear an ankle sleep. Her swelling is much improved. She also was noted to have chronic stress reaction second metatarsal. She started wearing cushioned shoes around the house, instead of going barefoot and she says that has made a huge difference in her pain control. She is following up today to make sure that she's improved. Her bilateral heel pain is much improved as well. At least 50%.   Review of Systems     Objective:   Physical Exam  Constitutional: She appears well-developed and well-nourished.  Musculoskeletal:       Left ankle with normal range of motion. No increased laxity of the joint. She did have some pain with anterior drawer test. Strength is 5 out of 5 in all directions. Nontender at the base of the heels bilaterally.  Skin: Skin is warm and dry.  Psychiatric: She has a normal mood and affect. Her behavior is normal.          Assessment & Plan:  Left ankle sprain-much improved. Resolving well. Her strength is good. Continue conservative care.  Continue strengthening exercises.  Stress reaction to the second metatarsal in the left foot-she's much improved with using more cushioned footwear and avoiding going barefoot. I gave her some metatarsal pads and sugar had to put them in her shoes today. I think this should make a big difference. She might want to consider custom orthotics.  Bilateral heel pain-doing much better now she is wearing cushioned shoe wear. Call if pain is persisting.

## 2012-10-26 ENCOUNTER — Telehealth: Payer: Self-pay | Admitting: *Deleted

## 2012-10-26 MED ORDER — ALPRAZOLAM 0.25 MG PO TABS: 0.2500 mg | ORAL_TABLET | Freq: Every day | ORAL | Status: DC | PRN

## 2012-10-26 NOTE — Telephone Encounter (Signed)
Sent to pharmacy 

## 2012-10-26 NOTE — Telephone Encounter (Signed)
Patient calls and request a refill on Xanax- last got them in 10/2011 and only uses as needed. Send to [facility] in [place] if ok

## 2012-12-28 ENCOUNTER — Telehealth: Payer: Self-pay | Admitting: *Deleted

## 2012-12-28 DIAGNOSIS — M25572 Pain in left ankle and joints of left foot: Secondary | ICD-10-CM

## 2012-12-28 NOTE — Telephone Encounter (Signed)
Referral placed. I'm sorry she still having a lot of pain. I think last time I saw her it was significantly improved.

## 2012-12-28 NOTE — Telephone Encounter (Signed)
Patient calls and states that she needs a referral sent over to [facility]- she has appt there on 01/06/2013.

## 2012-12-28 NOTE — Telephone Encounter (Signed)
Hurt ankle back in December and been 6 months and still having problems. Left ankle pain, swelling.

## 2012-12-28 NOTE — Telephone Encounter (Signed)
?   What for

## 2013-04-01 ENCOUNTER — Other Ambulatory Visit: Payer: Self-pay | Admitting: Family Medicine

## 2013-06-05 ENCOUNTER — Ambulatory Visit (INDEPENDENT_AMBULATORY_CARE_PROVIDER_SITE_OTHER): Payer: PRIVATE HEALTH INSURANCE | Admitting: Family Medicine

## 2013-06-05 ENCOUNTER — Encounter: Payer: Self-pay | Admitting: Family Medicine

## 2013-06-05 VITALS — BP 136/82 | HR 73 | Wt 248.0 lb

## 2013-06-05 DIAGNOSIS — Z23 Encounter for immunization: Secondary | ICD-10-CM

## 2013-06-05 DIAGNOSIS — F341 Dysthymic disorder: Secondary | ICD-10-CM

## 2013-06-05 DIAGNOSIS — F418 Other specified anxiety disorders: Secondary | ICD-10-CM

## 2013-06-05 MED ORDER — SERTRALINE HCL 50 MG PO TABS: ORAL_TABLET | ORAL | Status: DC

## 2013-06-05 MED ORDER — ESCITALOPRAM OXALATE 10 MG PO TABS: ORAL_TABLET | ORAL | Status: DC

## 2013-06-05 NOTE — Progress Notes (Signed)
  Subjective:    Patient ID: Julie Carroll, female    DOB: [birth year], 37 y.o.   MRN: [id number]  HPI  She does complain of feeling nervous and on edge nearly every day as well as not being able to control her worrying. She's having trouble relaxing and becoming easily annoyed and irritable. She has the feeling of something awful can happen. She also complains of sleep difficulty as well as feeling down or depressed more than half the days. She's noticed low energy as well as appetite change and low self-esteem. She ran ouf her meds and off it for 3-4 weeks.  Says the lexapro did work well nad feels needs to restart it.  Has used 3-4 xanax in the last 2 weeks.  Not sleeping well.  Got worse off the lexapro.    Review of Systems     Objective:   Physical Exam  Constitutional: She is oriented to person, place, and time. She appears well-developed and well-nourished.  HENT:  Head: Normocephalic and atraumatic.  Cardiovascular: Normal rate, regular rhythm and normal heart sounds.   Pulmonary/Chest: Effort normal and breath sounds normal.  Neurological: She is alert and oriented to person, place, and time.  Skin: Skin is warm and dry.  Psychiatric: She has a normal mood and affect. Her behavior is normal.          Assessment & Plan:  Depression/Anxiety - PHQ 9 score of 13. Gad 7 score of 19. We discussed different options. We could certainly restart the Lexapro or we could consider a different medication. She was RE taking 20 mg of Lexapro and was not completely therapeutic. I recommend a trial of something different. She did try fluoxetine in the past but had increased irritability. Will start sertraline. Start with 25 mg daily for one week and then increase to 50 mg. I explained To her will likely take 100 mg to be completely therapeutic. F/U in 3-4 weeks.  Flu vaccine given.

## 2013-06-13 ENCOUNTER — Encounter: Payer: Self-pay | Admitting: Family Medicine

## 2013-06-13 ENCOUNTER — Ambulatory Visit (INDEPENDENT_AMBULATORY_CARE_PROVIDER_SITE_OTHER): Payer: PRIVATE HEALTH INSURANCE | Admitting: Family Medicine

## 2013-06-13 VITALS — BP 135/76 | HR 78 | Temp 97.1°F | Wt 252.0 lb

## 2013-06-13 DIAGNOSIS — R319 Hematuria, unspecified: Secondary | ICD-10-CM

## 2013-06-13 DIAGNOSIS — N390 Urinary tract infection, site not specified: Secondary | ICD-10-CM

## 2013-06-13 LAB — POCT URINALYSIS DIPSTICK
Nitrite, UA: POSITIVE
Protein, UA: NEGATIVE
pH, UA: 6

## 2013-06-13 MED ORDER — CIPROFLOXACIN HCL 500 MG PO TABS: 500.0000 mg | ORAL_TABLET | Freq: Two times a day (BID) | ORAL | Status: DC

## 2013-06-13 NOTE — Progress Notes (Signed)
  Subjective:    Patient ID: Julie Carroll, female    DOB: 1975-11-15, 37 y.o.   MRN: [id number]  HPI Dysuria- started saturday she has been drinking more water cranberry juice and taking cranberry tabs Having a lot of pelvic pressure and low back pain as well. She is leaving.  No fever or chills or sweats. No hematuria. Urine has been cloudy.  Taking AZO for acute relief.     Review of Systems     Objective:   Physical Exam  Constitutional: She is oriented to person, place, and time. She appears well-developed and well-nourished.  HENT:  Head: Normocephalic and atraumatic.  Cardiovascular: Normal rate, regular rhythm and normal heart sounds.   Pulmonary/Chest: Effort normal and breath sounds normal.  Musculoskeletal:  No CVA tenderness   Neurological: She is alert and oriented to person, place, and time.  Skin: Skin is warm and dry.  Psychiatric: She has a normal mood and affect. Her behavior is normal.          Assessment & Plan:  UTI - will tx with cipro for 5 days. Call if not better in 3 days. Continue symptomatic care and increase fluids.

## 2013-06-13 NOTE — Addendum Note (Signed)
Addended by: Deno Etienne on: 06/13/2013 04:34 PM   Modules accepted: Orders

## 2013-06-16 LAB — URINE CULTURE: Culture: >=100000

## 2013-07-03 ENCOUNTER — Encounter: Payer: Self-pay | Admitting: Family Medicine

## 2013-07-03 ENCOUNTER — Ambulatory Visit (INDEPENDENT_AMBULATORY_CARE_PROVIDER_SITE_OTHER): Payer: PRIVATE HEALTH INSURANCE | Admitting: Family Medicine

## 2013-07-03 VITALS — BP 119/70 | HR 62

## 2013-07-03 DIAGNOSIS — F4322 Adjustment disorder with anxiety: Secondary | ICD-10-CM

## 2013-07-03 MED ORDER — SERTRALINE HCL 100 MG PO TABS: 100.0000 mg | ORAL_TABLET | Freq: Every day | ORAL | Status: DC

## 2013-07-03 MED ORDER — ALPRAZOLAM 0.25 MG PO TABS: 0.2500 mg | ORAL_TABLET | Freq: Every day | ORAL | Status: DC | PRN

## 2013-07-03 NOTE — Progress Notes (Signed)
   Subjective:    Patient ID: Julie Carroll, female    DOB: 1976/04/09, 37 y.o.   MRN: [id number]  HPI Depression/anxiety. I saw her approximately one month ago.PHQ 9 score of 13. Gad 7 score of 19.  We decided to start her on sertraline. She's here for one-month followup on the medication.  She still has had some trouble falling asleep. Still feeling little bit tired and feeling down several days. She still notices some irritability as well as filling of some on edge more than half of the days. But overall she's no significant improvement with the sertraline and has not had any negative side effects. She's happy with this particular regimen.   Review of Systems     Objective:   Physical Exam  Constitutional: She is oriented to person, place, and time. She appears well-developed and well-nourished.  HENT:  Head: Normocephalic and atraumatic.  Cardiovascular: Normal rate, regular rhythm and normal heart sounds.   Pulmonary/Chest: Effort normal and breath sounds normal.  Neurological: She is alert and oriented to person, place, and time.  Skin: Skin is warm and dry.  Psychiatric: She has a normal mood and affect. Her behavior is normal.          Assessment & Plan:  Depression/anxiety-gad 7 score of 8 today and PHQ 9 score of 8 today as well. Significantly improved from 4 weeks ago. Will increase to 100mg  and f/u in 6 weeks.  Call if any problems or side effects.

## 2013-08-12 IMAGING — CR DG FOOT 2V*L*
2 series · 2 of 2 positions shown · non-contrast
Comparison: None

CLINICAL DATA: Foot pain.

LEFT FOOT - 2 VIEW

[view not recorded (1 of 2)]
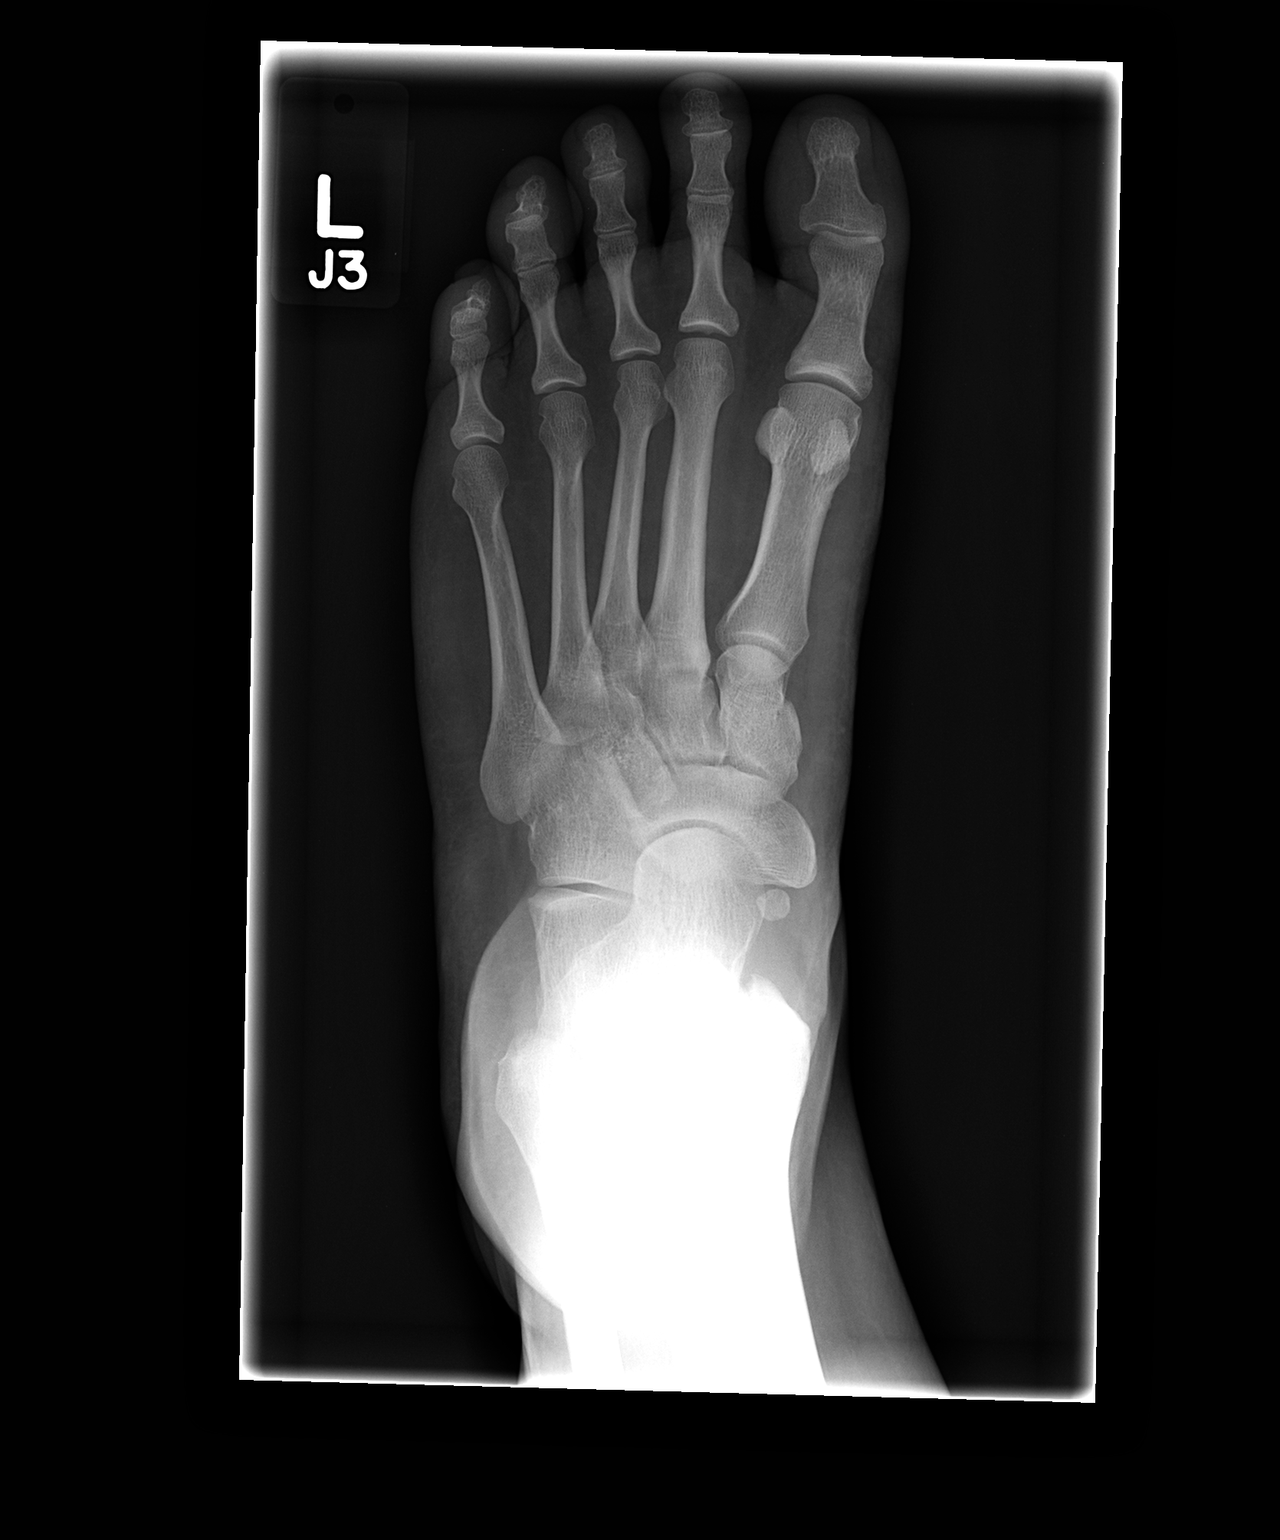

[view not recorded (2 of 2)]
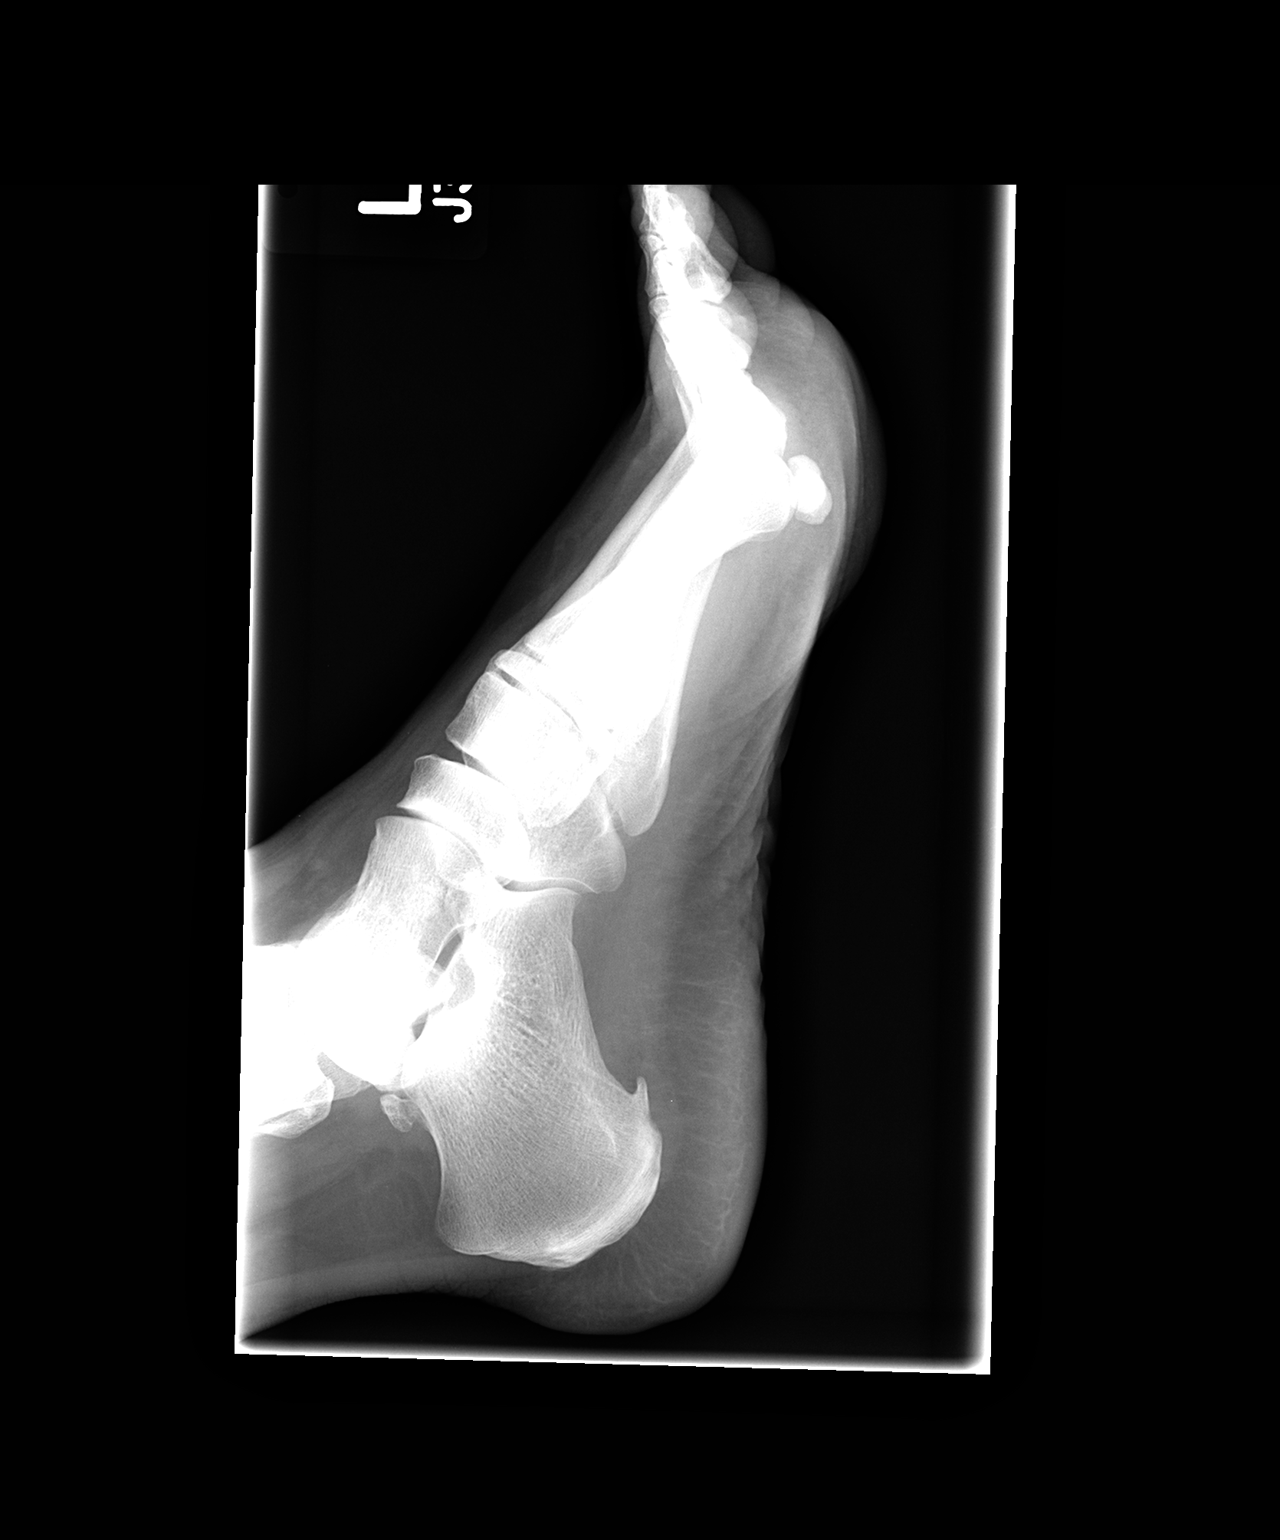

[2 of 2 positions shown; findings below may reference images not displayed]

FINDINGS: The joint spaces are maintained.  No acute fractures
identified.  The slight increased density cortical thickening
involving the second metatarsal in relation to the other
metatarsals.  This could be a chronic stress reaction.  No definite
stress fracture.  MR Tiger be helpful for further evaluation if
symptoms persist.
IMPRESSION: 1.  No acute bony findings.
2.  Mild diffuse increased density and cortical thickening
involving the second metatarsal may be chronic stress-related
process.  No obvious stress fracture.

## 2013-08-14 ENCOUNTER — Ambulatory Visit: Payer: PRIVATE HEALTH INSURANCE | Admitting: Family Medicine

## 2013-08-28 ENCOUNTER — Ambulatory Visit (INDEPENDENT_AMBULATORY_CARE_PROVIDER_SITE_OTHER): Payer: PRIVATE HEALTH INSURANCE | Admitting: Family Medicine

## 2013-08-28 ENCOUNTER — Encounter: Payer: Self-pay | Admitting: Family Medicine

## 2013-08-28 VITALS — BP 116/67 | HR 57 | Temp 98.4°F | Ht 65.0 in | Wt 250.0 lb

## 2013-08-28 DIAGNOSIS — F4322 Adjustment disorder with anxiety: Secondary | ICD-10-CM

## 2013-08-28 DIAGNOSIS — G4700 Insomnia, unspecified: Secondary | ICD-10-CM

## 2013-08-28 MED ORDER — SERTRALINE HCL 100 MG PO TABS: 100.0000 mg | ORAL_TABLET | Freq: Every day | ORAL | Status: DC

## 2013-08-28 MED ORDER — RAMELTEON 8 MG PO TABS: 8.0000 mg | ORAL_TABLET | Freq: Every evening | ORAL | Status: DC | PRN

## 2013-08-28 NOTE — Progress Notes (Signed)
   Subjective:    Patient ID: Julie Carroll, female    DOB: 08/15/1975, 38 y.o.   MRN: [id number]  HPI Mood D/O doing well on sertraline 100mg . We inc her dose 6 weeks ago. No S.E.    Inomsnia- says can fall asleep ok but says waking up frequently about 4 times a night. Says then wakes up and thoughts race. [facility]. All of her seizures affect actually been sleep deprived induced.  Review of Systems     Objective:   Physical Exam  Constitutional: She is oriented to person, place, and time. She appears well-developed and well-nourished.  HENT:  Head: Normocephalic and atraumatic.  Cardiovascular: Normal rate, regular rhythm and normal heart sounds.   Pulmonary/Chest: Effort normal and breath sounds normal.  Neurological: She is alert and oriented to person, place, and time.  Skin: Skin is warm and dry.  Psychiatric: She has a normal mood and affect. Her behavior is normal.          Assessment & Plan:  Adjustment disorder with anxiety - GAD- 7 score6  and PHQ-9 score of  5.  Compared to last time when her PHQ 9 score was 13 and her gad 7 score was 19. She has made great progress I think she's doing fantastic. I like to really focus and on the sleep issue at this point time. If we can improve that I think that that would continue to improve her mood as well. Otherwise followup in 3 months.  Insomnia-please see notes above. She does go to bed and wake up the regular time. We did discuss sleep hygiene. I do think should be good candidate for medication. I would like to see if her insurance will cover Rozerem. I think it would be the most benign choice out of the sleep aids especially with her history of seizure disorder. If not covered then consider a trial of trazodone.

## 2013-12-18 ENCOUNTER — Ambulatory Visit (INDEPENDENT_AMBULATORY_CARE_PROVIDER_SITE_OTHER): Payer: Managed Care, Other (non HMO) | Admitting: Family Medicine

## 2013-12-18 ENCOUNTER — Encounter: Payer: Self-pay | Admitting: Family Medicine

## 2013-12-18 VITALS — BP 114/82 | HR 70 | Resp 18 | Wt 238.0 lb

## 2013-12-18 DIAGNOSIS — J019 Acute sinusitis, unspecified: Secondary | ICD-10-CM

## 2013-12-18 DIAGNOSIS — R059 Cough, unspecified: Secondary | ICD-10-CM

## 2013-12-18 DIAGNOSIS — R05 Cough: Secondary | ICD-10-CM

## 2013-12-18 MED ORDER — AMOXICILLIN-POT CLAVULANATE 875-125 MG PO TABS: 1.0000 | ORAL_TABLET | Freq: Two times a day (BID) | ORAL | Status: DC

## 2013-12-18 MED ORDER — METHYLPREDNISOLONE SODIUM SUCC 125 MG IJ SOLR
125.0000 mg | Freq: Once | INTRAMUSCULAR | Status: AC
  Administered 2013-12-18: 125 mg via INTRAMUSCULAR

## 2013-12-18 MED ORDER — ALBUTEROL SULFATE (2.5 MG/3ML) 0.083% IN NEBU
2.5000 mg | INHALATION_SOLUTION | Freq: Once | RESPIRATORY_TRACT | Status: AC
  Administered 2013-12-18: 2.5 mg via RESPIRATORY_TRACT

## 2013-12-18 NOTE — Progress Notes (Signed)
   Subjective:    Patient ID: Julie Carroll, female    DOB: 06/20/1976, 38 y.o.   MRN: [id number]  HPI 6 days of sinus congestion and feeling poorly. She developed a cough about 2 or 3 days ago with yellow productive sputum. No fevers, chills, sweats. Had lose her voice initially.  Cough is keeping her awake.  Very intense head pressure wores when she coughs. Using Tylenol for pain relief.  + diarrhea. + nausea.     Review of Systems     Objective:   Physical Exam  Constitutional: She is oriented to person, place, and time. She appears well-developed and well-nourished.  HENT:  Head: Normocephalic and atraumatic.  Right Ear: External ear normal.  Left Ear: External ear normal.  Nose: Nose normal.  Mouth/Throat: Oropharynx is clear and moist.  TMs and canals are clear.   Eyes: Conjunctivae and EOM are normal. Pupils are equal, round, and reactive to light.  Neck: Neck supple. No thyromegaly present.  Cardiovascular: Normal rate, regular rhythm and normal heart sounds.   Pulmonary/Chest: Effort normal and breath sounds normal. She has no wheezes.  Lymphadenopathy:    She has no cervical adenopathy.  Neurological: She is alert and oriented to person, place, and time.  Skin: Skin is warm and dry.  Psychiatric: She has a normal mood and affect.          Assessment & Plan:  acute sinusitis- will tx with augmentin.  Call if not better in a week.  Call if not tolerating well.

## 2013-12-18 NOTE — Addendum Note (Signed)
Addended by: Deno EtienneBARKLEY, Ahlijah Raia L on: 12/18/2013 02:39 PM   Modules accepted: Orders

## 2014-02-17 ENCOUNTER — Other Ambulatory Visit: Payer: Self-pay | Admitting: Family Medicine

## 2014-02-18 ENCOUNTER — Telehealth: Payer: Self-pay

## 2014-02-18 NOTE — Telephone Encounter (Signed)
Left a message for patient to call and schedule a Mood and Depression follow up appointment.

## 2014-03-24 ENCOUNTER — Other Ambulatory Visit: Payer: Self-pay | Admitting: Family Medicine

## 2014-05-10 ENCOUNTER — Other Ambulatory Visit: Payer: Self-pay

## 2014-05-10 MED ORDER — SERTRALINE HCL 100 MG PO TABS: 100.0000 mg | ORAL_TABLET | Freq: Every day | ORAL | Status: DC

## 2014-05-24 ENCOUNTER — Ambulatory Visit: Payer: PRIVATE HEALTH INSURANCE | Admitting: Family Medicine

## 2014-06-01 ENCOUNTER — Encounter: Payer: Self-pay | Admitting: Family Medicine

## 2014-06-01 ENCOUNTER — Ambulatory Visit (INDEPENDENT_AMBULATORY_CARE_PROVIDER_SITE_OTHER): Payer: Managed Care, Other (non HMO) | Admitting: Family Medicine

## 2014-06-01 VITALS — BP 127/75 | HR 80 | Temp 98.0°F | Ht 65.0 in | Wt 231.0 lb

## 2014-06-01 DIAGNOSIS — J0100 Acute maxillary sinusitis, unspecified: Secondary | ICD-10-CM

## 2014-06-01 DIAGNOSIS — F411 Generalized anxiety disorder: Secondary | ICD-10-CM

## 2014-06-01 MED ORDER — SERTRALINE HCL 100 MG PO TABS: 100.0000 mg | ORAL_TABLET | Freq: Every day | ORAL | Status: DC

## 2014-06-01 MED ORDER — AMOXICILLIN-POT CLAVULANATE 875-125 MG PO TABS: 1.0000 | ORAL_TABLET | Freq: Two times a day (BID) | ORAL | Status: DC

## 2014-06-01 NOTE — Progress Notes (Signed)
   Subjective:    Patient ID: Julie Carroll, female    DOB: 10/13/1975, 38 y.o.   MRN: [id number]  HPI  F/U anxiety - currently taking sertraline.  Uses xanax PRN. Has only used 10 tabs in the last 10 months.  Feels like she is stable.  No major stressors right now, though they may be looking for a new house in a few months. .  Sleep is ok.  NO S.E on the medication.  Doesn't want to adjust her dose. Complete filling oversewn at several days of the week as well as trouble relaxing. She does complain of some irritability several days of the week. Still feels down and depressed several days of the week. She denies any thoughts of harming herself. She denies any issues with concentrating or focus.  Sinus congestion and symptoms x  3 weeks. Getting green nasal d/c.  No fever, chills, or sweats.  Using Dayquail or Nyquail. + HA.    Review of Systems     Objective:   Physical Exam  Constitutional: She is oriented to person, place, and time. She appears well-developed and well-nourished.  HENT:  Head: Normocephalic and atraumatic.  Right Ear: External ear normal.  Left Ear: External ear normal.  Nose: Nose normal.  Mouth/Throat: Oropharynx is clear and moist.  TMs and canals are clear.   Eyes: Conjunctivae and EOM are normal. Pupils are equal, round, and reactive to light.  Neck: Neck supple. No thyromegaly present.  Cardiovascular: Normal rate, regular rhythm and normal heart sounds.   Pulmonary/Chest: Effort normal and breath sounds normal. She has no wheezes.  Lymphadenopathy:    She has no cervical adenopathy.  Neurological: She is alert and oriented to person, place, and time.  Skin: Skin is warm and dry.  Psychiatric: She has a normal mood and affect.          Assessment & Plan:  Anxiety - under fair control. PHQ- 9 score of 5, GAD-7 score of 7 today.  Stable. She is happy with her current dosing regimen and once the ligament where it is. Refills sent for 6 months. Follow-up in  6 months. She says she still has 10 tabs of her Xanax and does not need a refill at this time.  Acute sinusitis - will tx with augmentim. It sounds like she is actually been getting a little bit better over the last couple of days. Encouraged her to hold now and only fill the Augmentin if she feels like she suddenly getting worse or still not better by Monday. Okay to continue symptomatic care.

## 2014-06-01 NOTE — Patient Instructions (Signed)

## 2014-09-11 ENCOUNTER — Ambulatory Visit (INDEPENDENT_AMBULATORY_CARE_PROVIDER_SITE_OTHER): Payer: Managed Care, Other (non HMO) | Admitting: Family Medicine

## 2014-09-11 ENCOUNTER — Encounter: Payer: Self-pay | Admitting: Family Medicine

## 2014-09-11 VITALS — BP 116/68 | HR 54 | Ht 65.0 in | Wt 239.0 lb

## 2014-09-11 DIAGNOSIS — G43809 Other migraine, not intractable, without status migrainosus: Secondary | ICD-10-CM

## 2014-09-11 DIAGNOSIS — F4322 Adjustment disorder with anxiety: Secondary | ICD-10-CM

## 2014-09-11 MED ORDER — SERTRALINE HCL 100 MG PO TABS: 100.0000 mg | ORAL_TABLET | Freq: Every day | ORAL | Status: DC

## 2014-09-11 MED ORDER — ALPRAZOLAM 0.25 MG PO TABS: 0.2500 mg | ORAL_TABLET | Freq: Every day | ORAL | Status: DC | PRN

## 2014-09-11 MED ORDER — SERTRALINE HCL 100 MG PO TABS: 150.0000 mg | ORAL_TABLET | Freq: Every day | ORAL | Status: DC

## 2014-09-11 NOTE — Progress Notes (Signed)
   Subjective:    Patient ID: Julie Carroll, female    DOB: 08/08/1975, 39 y.o.   MRN: [id number]  HPI F/U mood - has been having more migraines. Went to the Neurologist last week and they adjusted her meds.  Says feels her mood has been declining some  Sleep is fair.  Wakes up around 4:30 most days.  Husband has notcied she is pulling away from friends.  Says her anxiety is better controlled but feeling more down. Does complete a little inch to pleasure doing things more than half the days and feeling down more than half the days. Feels nervous and on age several days of the week and has difficulty controlling her worry. She does complain of irritability newly every day. No thoughts of harming herself.  Review of Systems     Objective:   Physical Exam  Constitutional: She is oriented to person, place, and time. She appears well-developed and well-nourished.  HENT:  Head: Normocephalic and atraumatic.  Cardiovascular: Normal rate, regular rhythm and normal heart sounds.   Pulmonary/Chest: Effort normal and breath sounds normal.  Neurological: She is alert and oriented to person, place, and time.  Skin: Skin is warm and dry.  Psychiatric: She has a normal mood and affect. Her behavior is normal.          Assessment & Plan:  Anxiety/depression -worsening symptoms. PHQ 9 score of 9 today previous was 5. Gad 7 score of 11 today, previous of 7. Discussed options for treatment. We can certainly consider referral for therapy we could adjust her medication. Also discussed maybe a trial of light therapy since she does notice a trend of decreased mood over the winter time. She opted to try increasing the sertraline 250 mg. Follow-up in 6-8 weeks to make sure that she's doing well and can adjust the patient's at that time.  Migraines-uncontrolled. But working with her neurologist. They recently increased her lamotrigine and she is hoping over the next month or 2 it will help decrease her migraines.

## 2014-12-05 ENCOUNTER — Other Ambulatory Visit: Payer: Self-pay | Admitting: Family Medicine

## 2015-03-26 ENCOUNTER — Other Ambulatory Visit: Payer: Self-pay | Admitting: Family Medicine

## 2015-09-02 ENCOUNTER — Encounter: Payer: Self-pay | Admitting: Family Medicine

## 2015-09-02 ENCOUNTER — Ambulatory Visit (INDEPENDENT_AMBULATORY_CARE_PROVIDER_SITE_OTHER): Payer: Managed Care, Other (non HMO) | Admitting: Family Medicine

## 2015-09-02 VITALS — BP 132/77 | HR 77 | Wt 261.0 lb

## 2015-09-02 DIAGNOSIS — F4322 Adjustment disorder with anxiety: Secondary | ICD-10-CM

## 2015-09-02 DIAGNOSIS — G43809 Other migraine, not intractable, without status migrainosus: Secondary | ICD-10-CM | POA: Diagnosis not present

## 2015-09-02 MED ORDER — ALPRAZOLAM 0.25 MG PO TABS: 0.2500 mg | ORAL_TABLET | Freq: Every day | ORAL | Status: AC | PRN

## 2015-09-02 MED ORDER — SERTRALINE HCL 100 MG PO TABS: 150.0000 mg | ORAL_TABLET | Freq: Every day | ORAL | Status: DC

## 2015-09-02 NOTE — Patient Instructions (Signed)
Start the zoloft with half a tab daily for one week, then increase to whole tab daily for 1-2 weeks and then can increase to 1.5 tabs daily.

## 2015-09-02 NOTE — Progress Notes (Addendum)
   Subjective:    Patient ID: Julie Carroll, female    DOB: [birth year], 40 y.o.   MRN: [id number]  HPI  Due for follow-up depression today. She does complain of feeling down several days of the week and still having difficulty with low energy and appetite. She denies any thoughts of wanting to harm herself. She also still complains of significant anxiety more than half the days as well as high levels of irritability daily. She actually slowly weaned herself off the Zoloft because she was unable to get in and was not able to get refills. She would like to restart the medication.  Has had some problems with her migraines but does see a specialist for this. She is on Lamictal and atenolol as well as Relpax for rescue.  Review of Systems     Objective:   Physical Exam  Constitutional: She is oriented to person, place, and time. She appears well-developed and well-nourished.  HENT:  Head: Normocephalic and atraumatic.  Cardiovascular: Normal rate, regular rhythm and normal heart sounds.   Pulmonary/Chest: Effort normal and breath sounds normal.  Neurological: She is alert and oriented to person, place, and time.  Skin: Skin is warm and dry.  Psychiatric: She has a normal mood and affect. Her behavior is normal.          Assessment & Plan:  Depression/anxiety-PHQ 9 score of 13 today and GADt 7 score of 11. She rates her symptoms is somewhat difficult.  As per discussion we will go ahead and restart sertraline which has worked well for her. Also refilled the alprazolam which she uses sparingly. She is only used about 18 tabs in the last year. She would like a refill on her alprazolam. She says she uses it very sparingly and most the time takes a half a tab if needed.  Migraine headaches-they have been under better control recently but she does follow with a specialist for this and is currently on atenolol and Lamictal.  She goes to [facility] for her pap smear.  Went last January.

## 2015-10-11 ENCOUNTER — Ambulatory Visit (INDEPENDENT_AMBULATORY_CARE_PROVIDER_SITE_OTHER): Payer: Managed Care, Other (non HMO) | Admitting: Family Medicine

## 2015-10-11 ENCOUNTER — Encounter: Payer: Self-pay | Admitting: Family Medicine

## 2015-10-11 VITALS — BP 124/58 | HR 61 | Temp 98.2°F | Wt 261.0 lb

## 2015-10-11 DIAGNOSIS — J209 Acute bronchitis, unspecified: Secondary | ICD-10-CM | POA: Diagnosis not present

## 2015-10-11 MED ORDER — TRAMADOL HCL 50 MG PO TABS: 50.0000 mg | ORAL_TABLET | Freq: Every evening | ORAL | Status: AC | PRN

## 2015-10-11 MED ORDER — IPRATROPIUM BROMIDE 0.06 % NA SOLN: 2.0000 | NASAL | Status: AC | PRN

## 2015-10-11 MED ORDER — AZITHROMYCIN 250 MG PO TABS: 250.0000 mg | ORAL_TABLET | Freq: Every day | ORAL | Status: AC

## 2015-10-11 MED ORDER — PREDNISONE 10 MG PO TABS: 30.0000 mg | ORAL_TABLET | Freq: Every day | ORAL | Status: AC

## 2015-10-11 NOTE — Assessment & Plan Note (Signed)
Viral URI likely bronchitis. Treat with prednisone and Atrovent nasal spray and tramadol for cough suppression as patient is allergic to hydrocodone. Use azithromycin antibiotics if not better.

## 2015-10-11 NOTE — Progress Notes (Signed)
       Kaitlan Merrilee Seashore Kundrat is a 40 y.o. female who [facility] today for cough congestion and runny nose sore throat. Symptoms present for 4 days. Cough is nonproductive. No vomiting diarrhea chest pain or palpitations. No trouble breathing or wheezing. Patient has tried over-the-counter Mucinex which has helped a little. She feels well otherwise.   Past Medical History  Diagnosis Date  . Gestational diabetes   . Preeclampsia 4/06  . Seizures (HCC) 4/04   Past Surgical History  Procedure Laterality Date  . Cesarean section  4/06  . Fatty tumor removed  1996   Social History  Substance Use Topics  . Smoking status: Former Smoker    Types: Cigarettes  . Smokeless tobacco: Never Used  . Alcohol Use: No   family history includes Cancer in her maternal grandmother; Diabetes in her father.  ROS as above Medications: Current Outpatient Prescriptions  Medication Sig Dispense Refill  . ALPRAZolam (XANAX) 0.25 MG tablet Take 1 tablet (0.25 mg total) by mouth daily as needed for anxiety. 20 tablet 0  . atenolol (TENORMIN) 50 MG tablet Take 50 mg by mouth.    . cetirizine (ZYRTEC) 10 MG tablet Take 10 mg by mouth daily.    Marland Kitchen. eletriptan (RELPAX) 20 MG tablet Take 20 mg by mouth as needed for migraine or headache. One tablet by mouth at onset of headache. May repeat in 2 hours if headache persists or recurs.    . lamoTRIgine (LAMICTAL) 100 MG tablet Two in AM and two in the PM    . MAGNESIUM PO Take by mouth.    Marland Kitchen. RIBOFLAVIN PO Take by mouth.    . sertraline (ZOLOFT) 100 MG tablet Take 1.5 tablets (150 mg total) by mouth daily. 45 tablet 5  . azithromycin (ZITHROMAX) 250 MG tablet Take 1 tablet (250 mg total) by mouth daily. Take first 2 tablets together, then 1 every day until finished. 6 tablet 0  . ipratropium (ATROVENT) 0.06 % nasal spray Place 2 sprays into both nostrils every 4  (four) hours as needed for rhinitis. 10 mL 6  . predniSONE (DELTASONE) 10 MG tablet Take 3 tablets (30 mg total) by mouth daily with breakfast. 15 tablet 0  . traMADol (ULTRAM) 50 MG tablet Take 1 tablet (50 mg total) by mouth at bedtime as needed (cough). 10 tablet 0   No current facility-administered medications for this visit.   Allergies  Allergen Reactions  . Fluoxetine     Nausea and inc irritability  . Hydrocodone-Acetaminophen     n/v     Exam:  BP 124/58 mmHg  Pulse 61  Temp(Src) 98.2 F (36.8 C) (Oral)  Wt 261 lb (118.389 kg)  SpO2 98% Gen: Well NAD HEENT: EOMI,  MMM Clear nasal discharge. Posterior pharynx with cobblestoning. Normal tympanic membranes bilaterally. Nontender maxillary and frontal sinuses bilaterally.  Lungs: Normal work of breathing. CTABL Heart: RRR no MRG Abd: NABS, Soft. Nondistended, Nontender Exts: Brisk capillary refill, warm and well perfused.   No results found for this or any previous visit (from the past 24 hour(s)). No results found.   Please see individual assessment and plan sections.

## 2015-10-11 NOTE — Patient Instructions (Signed)
Thank you for coming in today. Return as needed,.  Take the azithromycin antibiotic if not better.  Use tramadol sparingly. This medicine will make you tired. Do not drive after taking the medicine.  Call or go to the emergency room if you get worse, have trouble breathing, have chest pains, or palpitations.   Acute Bronchitis Bronchitis is inflammation of the airways that extend from the windpipe into the lungs (bronchi). The inflammation often causes mucus to develop. This leads to a cough, which is the most common symptom of bronchitis.  In acute bronchitis, the condition usually develops suddenly and goes away over time, usually in a couple weeks. Smoking, allergies, and asthma can make bronchitis worse. Repeated episodes of bronchitis may cause further lung problems.  CAUSES Acute bronchitis is most often caused by the same virus that causes a cold. The virus can spread from person to person (contagious) through coughing, sneezing, and touching contaminated objects. SIGNS AND SYMPTOMS   Cough.   Fever.   Coughing up mucus.   Body aches.   Chest congestion.   Chills.   Shortness of breath.   Sore throat.  DIAGNOSIS  Acute bronchitis is usually diagnosed through a physical exam. Your health care provider will also ask you questions about your medical history. Tests, such as chest X-rays, are sometimes done to rule out other conditions.  TREATMENT  Acute bronchitis usually goes away in a couple weeks. Oftentimes, no medical treatment is necessary. Medicines are sometimes given for relief of fever or cough. Antibiotic medicines are usually not needed but may be prescribed in certain situations. In some cases, an inhaler may be recommended to help reduce shortness of breath and control the cough. A cool mist vaporizer may also be used to help thin bronchial secretions and make it easier to clear the chest.  HOME CARE INSTRUCTIONS  Get plenty of rest.   Drink enough fluids  to keep your urine clear or pale yellow (unless you have a medical condition that requires fluid restriction). Increasing fluids may help thin your respiratory secretions (sputum) and reduce chest congestion, and it will prevent dehydration.   Take medicines only as directed by your health care provider.  If you were prescribed an antibiotic medicine, finish it all even if you start to feel better.  Avoid smoking and secondhand smoke. Exposure to cigarette smoke or irritating chemicals will make bronchitis worse. If you are a smoker, consider using nicotine gum or skin patches to help control withdrawal symptoms. Quitting smoking will help your lungs heal faster.   Reduce the chances of another bout of acute bronchitis by washing your hands frequently, avoiding people with cold symptoms, and trying not to touch your hands to your mouth, nose, or eyes.   Keep all follow-up visits as directed by your health care provider.  SEEK MEDICAL CARE IF: Your symptoms do not improve after 1 week of treatment.  SEEK IMMEDIATE MEDICAL CARE IF:  You develop an increased fever or chills.   You have chest pain.   You have severe shortness of breath.  You have bloody sputum.   You develop dehydration.  You faint or repeatedly feel like you are going to pass out.  You develop repeated vomiting.  You develop a severe headache. MAKE SURE YOU:   Understand these instructions.  Will watch your condition.  Will get help right away if you are not doing well or get worse.   This information is not intended to replace advice given to  you by your health care provider. Make sure you discuss any questions you have with your health care provider.   Document Released: 08/27/2004 Document Revised: 08/10/2014 Document Reviewed: 01/10/2013 Elsevier Interactive Patient Education [employer].

## 2016-04-07 ENCOUNTER — Other Ambulatory Visit: Payer: Self-pay | Admitting: Family Medicine

## 2016-05-11 ENCOUNTER — Telehealth: Payer: Self-pay | Admitting: Family Medicine

## 2016-05-11 NOTE — Telephone Encounter (Signed)
I left pt a message to call and schedule a f/u appt with Dr. Linford ArnoldMetheney on her meds

## 2016-06-05 ENCOUNTER — Ambulatory Visit: Payer: Managed Care, Other (non HMO) | Admitting: Family Medicine

## 2018-09-05 LAB — HM MAMMOGRAPHY

## 2018-09-22 ENCOUNTER — Encounter: Payer: Self-pay | Admitting: Family Medicine

## 2021-04-16 LAB — EXTERNAL GENERIC LAB PROCEDURE: COLOGUARD: NEGATIVE

## 2023-06-17 ENCOUNTER — Encounter: Payer: Self-pay | Admitting: Plastic Surgery

## 2023-06-17 ENCOUNTER — Ambulatory Visit: Payer: BC Managed Care – PPO | Admitting: Plastic Surgery

## 2023-06-17 VITALS — BP 139/85 | HR 61 | Ht 64.5 in | Wt 264.8 lb

## 2023-06-17 DIAGNOSIS — N62 Hypertrophy of breast: Secondary | ICD-10-CM

## 2023-06-17 DIAGNOSIS — M549 Dorsalgia, unspecified: Secondary | ICD-10-CM | POA: Diagnosis not present

## 2023-06-17 DIAGNOSIS — Z6841 Body Mass Index (BMI) 40.0 and over, adult: Secondary | ICD-10-CM | POA: Diagnosis not present

## 2023-06-17 DIAGNOSIS — M542 Cervicalgia: Secondary | ICD-10-CM | POA: Diagnosis not present

## 2023-06-17 NOTE — Progress Notes (Signed)
Referring Provider Agapito Games, MD [address] [place],  [place] [address]   CC:  Chief Complaint  Patient presents with   Consult      Julie Carroll is an 47 y.o. female.  HPI: Julie Carroll is a 47 year old female who presents today with complaints of upper back and neck pain, migraines, difficulty finding bras that fit appropriately.  She is requesting a bilateral breast reduction.  She states that she has had difficulty with large size of her breast for most of her life.  She is considered a breast reduction in the past but feels this is the appropriate time.  Allergies  Allergen Reactions   Fluoxetine     Nausea and inc irritability   Hydrocodone-Acetaminophen     n/v    Outpatient Encounter Medications as of 06/17/2023  Medication Sig Note   atenolol (TENORMIN) 50 MG tablet Take 50 mg by mouth. 09/11/2014: Received from: [facility]   azithromycin (ZITHROMAX) 250 MG tablet Take 1 tablet (250 mg total) by mouth daily. Take first 2 tablets together, then 1 every day until finished.    busPIRone (BUSPAR) 5 MG tablet Take by mouth. Taking 15mg  per day.    cetirizine (ZYRTEC) 10 MG tablet Take 10 mg by mouth daily.    clonazePAM (KLONOPIN) 0.5 MG tablet Take 0.5 mg by mouth as needed.    eletriptan (RELPAX) 20 MG tablet Take 20 mg by mouth as needed for migraine or headache. One tablet by mouth at onset of headache. May repeat in 2 hours if headache persists or recurs.    ipratropium (ATROVENT) 0.06 % nasal spray Place 2 sprays into both nostrils every 4 (four) hours as needed for rhinitis.    lamoTRIgine (LAMICTAL) 100 MG tablet Two in AM and two in the PM 09/11/2014: Received from: [facility]   MAGNESIUM PO Take by mouth.    predniSONE (DELTASONE) 10 MG tablet Take 3 tablets (30 mg total) by mouth daily with breakfast.    RIBOFLAVIN PO Take by mouth.    sertraline (ZOLOFT) 100 MG tablet TAKE 1 & 1/2 tablets BY MOUTH EVERY DAY    traMADol  (ULTRAM) 50 MG tablet Take 1 tablet (50 mg total) by mouth at bedtime as needed (cough).    ALPRAZolam (XANAX) 0.25 MG tablet Take 1 tablet (0.25 mg total) by mouth daily as needed for anxiety. (Patient not taking: Reported on 06/17/2023)    No facility-administered encounter medications on file as of 06/17/2023.     Past Medical History:  Diagnosis Date   Gestational diabetes    Preeclampsia 4/06   Seizures (HCC) 4/04    Past Surgical History:  Procedure Laterality Date   CESAREAN SECTION  4/06   fatty tumor removed  1996    Family History  Problem Relation Age of Onset   Diabetes Father    Cancer Maternal Grandmother        skin    Social History   Social History Narrative   Not on file     Review of Systems General: Denies fevers, chills, weight loss CV: Denies chest pain, shortness of breath, palpitations Breast: Patient feels that the large size of her breasts are contributing to her upper back and neck pain.  She is spoken with her neurologist who feels that her breast reduction may improve her migraines.  Physical Exam    06/17/2023    8:13 AM 10/11/2015   11:06 AM 09/02/2015  4:21 PM  Vitals with BMI  Height 5' 4.5"    Weight 264 lbs 13 oz 261 lbs 261 lbs  BMI 44.77    Systolic 139 124 016  Diastolic 85 58 77  Pulse 61 61 77    General:  No acute distress,  Alert and oriented, Non-Toxic, Normal speech and affect Breast: Patient is not examined on this visit. Mammogram: No mammograms documented since 2020.  The referral mentions a need for mammography however it is not clear that this is done we will offer to order a mammogram for the patient. Assessment/Plan Symptomatic macromastia: Patient has symptoms that are generally attributed to large size of breast.  These include upper back and neck pain and difficulty finding bras that fit appropriately.  It also includes finding bras that do not leave grooves in the shoulders.  I have discussed with her that  treatment of migraines is typically not considered an indication for a breast reduction and that I think it unlikely that her migraines will improve with surgery.  Additionally we discussed the patient's weight and hemoglobin A1c.  The patient's weight is currently a BMI of 45 I typically do not operate on patients until the BMI is under 40.  Additionally she will need to have a hemoglobin A1c no greater than 6.3 prior to surgery.  She understands that both of these requests are due to increased complications after surgery.  She will speak with her primary care provider.  I have offered her a referral to healthy weight and wellness but she prefers to do this closer to her home.  She may return at any point that she likes.  Either as she is in the process of losing weight or when she has met her BMI of 40 goal.  30 minutes was spent reviewing the chart, discussing management with the patient, coordinating care, and documenting.  Santiago Glad 06/17/2023, 10:04 AM

## 2023-06-18 ENCOUNTER — Telehealth: Payer: Self-pay

## 2023-06-18 NOTE — Telephone Encounter (Signed)
[facility]C-pt needs mammogram done before surgery. An order was placed by [facility]. She can complete with them or we can put an order in for her with [facility].
